# Patient Record
Sex: Male | Born: 1945 | Race: White | Hispanic: No | Smoking: Former smoker
Health system: Southern US, Community
[De-identification: ages and names within clinical notes are randomized; demographics above are authoritative.]

## PROBLEM LIST (undated history)

## (undated) DIAGNOSIS — E785 Hyperlipidemia, unspecified: Secondary | ICD-10-CM

## (undated) DIAGNOSIS — F0390 Unspecified dementia without behavioral disturbance: Secondary | ICD-10-CM

## (undated) DIAGNOSIS — I10 Essential (primary) hypertension: Secondary | ICD-10-CM

## (undated) DIAGNOSIS — R41841 Cognitive communication deficit: Secondary | ICD-10-CM

## (undated) HISTORY — DX: Cognitive communication deficit: R41.841

## (undated) HISTORY — DX: Unspecified dementia, unspecified severity, without behavioral disturbance, psychotic disturbance, mood disturbance, and anxiety: F03.90

## (undated) HISTORY — PX: JOINT REPLACEMENT: SHX530

## (undated) HISTORY — DX: Hyperlipidemia, unspecified: E78.5

---

## 2014-01-02 ENCOUNTER — Emergency Department: Payer: Self-pay | Admitting: Emergency Medicine

## 2014-01-02 LAB — COMPREHENSIVE METABOLIC PANEL
ALBUMIN: 3.7 g/dL (ref 3.4–5.0)
ANION GAP: 8 (ref 7–16)
AST: 34 U/L (ref 15–37)
Alkaline Phosphatase: 65 U/L
BUN: 13 mg/dL (ref 7–18)
Bilirubin,Total: 0.2 mg/dL (ref 0.2–1.0)
CO2: 26 mmol/L (ref 21–32)
CREATININE: 0.87 mg/dL (ref 0.60–1.30)
Calcium, Total: 8.7 mg/dL (ref 8.5–10.1)
Chloride: 99 mmol/L (ref 98–107)
EGFR (African American): >60
Glucose: 124 mg/dL — ABNORMAL HIGH (ref 65–99)
Osmolality: 268 (ref 275–301)
POTASSIUM: 3.4 mmol/L — AB (ref 3.5–5.1)
SGPT (ALT): 34 U/L (ref 12–78)
SODIUM: 133 mmol/L — AB (ref 136–145)
Total Protein: 7.8 g/dL (ref 6.4–8.2)

## 2014-01-02 LAB — CBC
HCT: 45.7 % (ref 40.0–52.0)
HGB: 15.3 g/dL (ref 13.0–18.0)
MCH: 31.9 pg (ref 26.0–34.0)
MCHC: 33.5 g/dL (ref 32.0–36.0)
MCV: 95 fL (ref 80–100)
PLATELETS: 308 10*3/uL (ref 150–440)
RBC: 4.81 10*6/uL (ref 4.40–5.90)
RDW: 13.3 % (ref 11.5–14.5)
WBC: 10.5 10*3/uL (ref 3.8–10.6)

## 2014-01-02 LAB — SALICYLATE LEVEL: Salicylates, Serum: <1.7 mg/dL

## 2014-01-02 LAB — ETHANOL
ETHANOL %: 0.306 % — AB (ref 0.000–0.080)
Ethanol: 306 mg/dL

## 2014-01-02 LAB — ACETAMINOPHEN LEVEL: Acetaminophen: <2 ug/mL

## 2014-01-02 LAB — TSH: THYROID STIMULATING HORM: 1.27 u[IU]/mL

## 2015-06-10 ENCOUNTER — Other Ambulatory Visit: Payer: Self-pay

## 2015-06-10 ENCOUNTER — Emergency Department
Admission: EM | Admit: 2015-06-10 | Discharge: 2015-06-10 | Disposition: A | Discharge status: Home / Self Care | Payer: Medicare Other | Attending: Emergency Medicine | Admitting: Emergency Medicine

## 2015-06-10 ENCOUNTER — Encounter: Payer: Self-pay | Admitting: Emergency Medicine

## 2015-06-10 DIAGNOSIS — Z79899 Other long term (current) drug therapy: Secondary | ICD-10-CM | POA: Insufficient documentation

## 2015-06-10 DIAGNOSIS — Z87891 Personal history of nicotine dependence: Secondary | ICD-10-CM | POA: Insufficient documentation

## 2015-06-10 DIAGNOSIS — J029 Acute pharyngitis, unspecified: Secondary | ICD-10-CM | POA: Diagnosis present

## 2015-06-10 DIAGNOSIS — J36 Peritonsillar abscess: Secondary | ICD-10-CM | POA: Diagnosis not present

## 2015-06-10 DIAGNOSIS — Z7982 Long term (current) use of aspirin: Secondary | ICD-10-CM | POA: Diagnosis not present

## 2015-06-10 DIAGNOSIS — I10 Essential (primary) hypertension: Secondary | ICD-10-CM | POA: Insufficient documentation

## 2015-06-10 HISTORY — DX: Essential (primary) hypertension: I10

## 2015-06-10 MED ORDER — AMOXICILLIN-POT CLAVULANATE 875-125 MG PO TABS
1.0000 | ORAL_TABLET | Freq: Once | ORAL | Status: AC
  Administered 2015-06-10: 1 via ORAL
  Filled 2015-06-10: qty 1

## 2015-06-10 MED ORDER — METHYLPREDNISOLONE 4 MG PO TABS: ORAL_TABLET | ORAL | Status: DC

## 2015-06-10 MED ORDER — BUTAMBEN-TETRACAINE-BENZOCAINE 2-2-14 % EX AERO
INHALATION_SPRAY | CUTANEOUS | Status: AC
  Administered 2015-06-10: 13:00:00
  Filled 2015-06-10: qty 20

## 2015-06-10 MED ORDER — HYDROMORPHONE HCL 1 MG/ML IJ SOLN
1.0000 mg | Freq: Once | INTRAMUSCULAR | Status: AC
  Administered 2015-06-10: 1 mg via INTRAVENOUS
  Filled 2015-06-10: qty 1

## 2015-06-10 MED ORDER — ONDANSETRON HCL 4 MG/2ML IJ SOLN
4.0000 mg | Freq: Once | INTRAMUSCULAR | Status: AC
  Administered 2015-06-10: 4 mg via INTRAVENOUS

## 2015-06-10 MED ORDER — ONDANSETRON HCL 4 MG/2ML IJ SOLN
INTRAMUSCULAR | Status: AC
  Administered 2015-06-10: 4 mg via INTRAVENOUS
  Filled 2015-06-10: qty 2

## 2015-06-10 MED ORDER — AMOXICILLIN-POT CLAVULANATE 875-125 MG PO TABS
1.0000 | ORAL_TABLET | Freq: Two times a day (BID) | ORAL | Status: AC
Start: 2015-06-10 — End: 2015-06-20

## 2015-06-10 MED ORDER — DEXAMETHASONE SODIUM PHOSPHATE 10 MG/ML IJ SOLN
10.0000 mg | Freq: Once | INTRAMUSCULAR | Status: AC
  Administered 2015-06-10: 10 mg via INTRAVENOUS
  Filled 2015-06-10: qty 1

## 2015-06-10 NOTE — ED Notes (Signed)
States was seen at urgent care for throat pain x 2 days, states was told he had abscess and sent here, no resp distress

## 2015-06-10 NOTE — ED Provider Notes (Signed)
[facility] Emergency Department Provider Note  ____________________________________________  Time seen: Approximately 12:31 PM  I have reviewed the triage vital signs and the nursing notes.   HISTORY  Chief Complaint Abscess    HPI William Schwartz is a 69 y.o. male patient sent from urgent care for possible peritonsillar abscess. Patient reports sore throat for 2 days some muffling of the voice when he spits he did spit up spit out some blood this morning after gargling. Patient complains of pain and soreness in the right side of his throat. 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 Hypertension     There are no active problems to display for this patient.   History reviewed. No pertinent past surgical history.  Current Outpatient Rx  Name  Route  Sig  Dispense  Refill  . aspirin EC 81 MG tablet   Oral   Take 81 mg by mouth every morning.         . cetirizine (ZYRTEC) 10 MG tablet   Oral   Take 10 mg by mouth daily.      3   . levothyroxine (SYNTHROID, LEVOTHROID) 125 MCG tablet   Oral   Take 125 mcg by mouth daily.      3   . lisinopril-hydrochlorothiazide (PRINZIDE,ZESTORETIC) 20-12.5 MG per tablet   Oral   Take 1 tablet by mouth every morning.      2   . metoprolol tartrate (LOPRESSOR) 25 MG tablet   Oral   Take 12.5 mg by mouth 2 (two) times daily.      10   . naproxen sodium (ALEVE) 220 MG tablet   Oral   Take 440 mg by mouth every other day.         . simvastatin (ZOCOR) 40 MG tablet   Oral   Take 40 mg by mouth at bedtime.      0   . amoxicillin-clavulanate (AUGMENTIN) 875-125 MG per tablet   Oral   Take 1 tablet by mouth every 12 (twelve) hours.   20 tablet   0   . methylPREDNISolone (MEDROL) 4 MG tablet      Medrol  dose pack, use as directed dispense 1   1 tablet   0     Allergies Review of  patient's allergies indicates no known allergies.  No family history on file.  Social History History  Substance Use Topics  . Smoking status: Former Games developer  . Smokeless tobacco: Not on file  . Alcohol Use: Yes    Review of Systems Constitutional: No fever/chills Eyes: No visual changes. ENT: See history of present illness Cardiovascular: Denies chest pain. Respiratory: Denies shortness of breath. Gastrointestinal: No abdominal pain.  No nausea, no vomiting.  No diarrhea.  No constipation. Genitourinary: Negative for dysuria. Musculoskeletal: Negative for back pain. Skin: Negative for rash. Neurological: Negative for headaches, focal weakness or numbness.  10-point ROS otherwise negative.  ____________________________________________   PHYSICAL EXAM:  VITAL SIGNS: ED Triage Vitals  Enc Vitals Group     BP 06/10/15 1049 144/83 mmHg     Pulse Rate 06/10/15 1049 88     Resp 06/10/15 1049 18     Temp 06/10/15 1049 98.5 F (36.9 C)     Temp Source 06/10/15 1049 Oral     SpO2 06/10/15 1049 99 %     Weight 06/10/15 1049 212 lb (96.163  kg)     Height 06/10/15 1049 5\' 10"  (1.778 m)     Head Cir --      Peak Flow --      Pain Score 06/10/15 1050 10     Pain Loc --      Pain Edu? --      Excl. in GC? --     Constitutional: Alert and oriented. Well appearing and in no acute distress. Eyes: Conjunctivae are normal. PERRL. EOMI. Head: Atraumatic. Nose: No congestion/rhinnorhea. Mouth/Throat: Mucous membranes are moist.  Patient with red swollen right tonsillar pillar and uvula deviating to the other side. Neck: No stridor.  Cardiovascular: Normal rate, regular rhythm. Grossly normal heart sounds.  Good peripheral circulation. Respiratory: Normal respiratory effort.  No retractions. Lungs CTAB. Gastrointestinal: Soft and nontender. No distention. No abdominal bruits. No CVA tenderness. Musculoskeletal: No lower extremity tenderness nor edema.  No joint  effusions. Neurologic:  Normal speech and language. No gross focal neurologic deficits are appreciated. No gait instability. Skin:  Skin is warm, dry and intact. No rash noted. Psychiatric: Mood and affect are normal. Speech and behavior are normal.  ____________________________________________   LABS (all labs ordered are listed, but only abnormal results are displayed)  Labs Reviewed - No data to display ____________________________________________  EKG   ____________________________________________  RADIOLOGY   ____________________________________________   PROCEDURES    ____________________________________________   INITIAL IMPRESSION / ASSESSMENT AND PLAN / ED COURSE  Pertinent labs & imaging results that were available during my care of the patient were reviewed by me and considered in my medical decision making (see chart for details).  Dr Elenore Rota comes in and drains the abscess ____________________________________________   FINAL CLINICAL IMPRESSION(S) / ED DIAGNOSES  Final diagnoses:  Tonsil, abscess      Arnaldo Natal, MD 06/10/15 1352

## 2015-06-10 NOTE — ED Notes (Signed)
Per Dr. Lannette Donath, pt okay for discharge

## 2015-06-10 NOTE — ED Notes (Signed)
Pt demonstrates no increased work of breathing and no distress

## 2015-06-10 NOTE — ED Notes (Signed)
Pt resting in bed quietly in no distress 

## 2015-06-10 NOTE — ED Notes (Signed)
Pt arrives with complaints of sore throat since Thursday, pt states he has been doing warm salt gargle and noticed blood in his spit, upon assessment pt denies any SOB or distress, large red inflammed area in the back of his right throat, pt voice full, Dr. Olean Ree notified of pt assessment, pt was seen in urgent care this AM and given rocephin

## 2015-06-10 NOTE — Consult Note (Signed)
Koehn, Salehi [id number] 1946/10/08 Arnaldo Natal, MD  Reason for Consult: Right peritonsillar abscess  HPI: Patient is a 69 year old white male who has had a history of rare tonsil problems, last infection a year and a half ago. He started with sore throat on Thursday and didn't swallow a lot for the last couple days. He coughs this morning and spit out some blood in his throat felt a little bit better. He presented the ER because sees not able to eat solid food, even though he's been drinking liquids pretty good. It feels like there is something pushing on the back of his tongue. He is having some referred pain to his right ear.  Allergies: No Known Allergies  ROS: Review of systems normal other than 12 systems except per HPI.  PMH:  Past Medical History  Diagnosis Date  . Hypertension     FH: No family history on file.  SH:  History   Social History  . Marital Status: Widowed    Spouse Name: N/A  . Number of Children: N/A  . Years of Education: N/A   Occupational History  . Not on file.   Social History Main Topics  . Smoking status: Former Games developer  . Smokeless tobacco: Not on file  . Alcohol Use: Yes  . Drug Use: Not on file  . Sexual Activity: Not on file   Other Topics Concern  . Not on file   Social History Narrative  . No narrative on file    PSH: History reviewed. No pertinent past surgical history.  Physical  Exam: Well-developed and well-nourished white male. CN 2-12 grossly intact and symmetric. EAC/TMs normal BL. Oral cavity shows his right tonsil to be quite large with an exudate area sheeting towards the midline. The uvula is slightly swollen and pushed to the left side. The soft palate is reddened and swollen. Skin warm and dry. Nasal cavity without polyps or purulence. External nose and ears without masses or lesions. EOMI, PERRLA. Neck supple with no masses or lesions. No lymphadenopathy palpated. Thyroid normal with no masses.   Aspiration of a  right peritonsillar abscess was performed and dictated in detail elsewhere. Total of 6 mL of frank pus was aspirated from the right peritonsillar area. This was sent for culture. He tolerated this well.   A/P: Right peritonsillar abscess, now aspirated for drainage. Will start on Augmentin 875 twice a day for 10 days and use a Medrol 4 mg Dosepak. If his problems lately settles down he doesn't need anything further, but if he should get swelling again in his right peritonsillar area or more pain he's to let me know and we will plan to remove his tonsils.   Jacqui Headen H 06/10/2015 1:38 PM

## 2015-06-10 NOTE — ED Notes (Addendum)
Pt had 1 episode of nausea, diaphoretic, flushed and hot, MD notified, pt given glass of water, pt just now had another episode of cold sweat, nausea, dry heaving and diaphoretic, MD at bedside, pt given zofran, EKG preformed, BP 171/90, HR 96, 95% on RA, discharge put on hold

## 2015-06-10 NOTE — Op Note (Signed)
06/10/2015  1:42 PM    William Schwartz  [id number]   Pre-Op Dx:  Right peritonsillar abscess  Post-op Dx: Right peritonsillar abscess  Proc: Aspiration of right peritonsillar abscess   Surg:  William Schwartz  Anes:  GOT  EBL:  Minimal  Comp:  None  Findings:  6 mL of frank pus removed  Procedure: After informed surgical request was signed the right soft palate overlying the tonsil was sprayed with Hurricaine spray. A 10 cc syringe with 18-gauge needle was used for aspiration of the right peritonsillar area. This was done at the superior lateral border of the tonsil. 6 mL of yellow brown pus was suctioned out and some of this was cultured. A second aspiration was done approximately 1 half-inch lower and small dribble of pus and blood. Review was removed. The patient felt almost immediately outer with less pressure there and able to swallow better  Dispo:   To be discharged home with oral medications  Plan:  Augmentin 875 twice a day for 10 days. Medrol 4 mg Dosepak. Ibuprofen for pain. Gargle every couple hours during the day to help clean out any exudate from the tonsil or polyps. Follow up in the office if the swelling and pain does not completely subside.     William Schwartz  06/10/2015 1:42 PM

## 2015-06-10 NOTE — ED Notes (Signed)
Abscess drained, MD at bedside

## 2015-06-13 LAB — WOUND CULTURE: CULTURE: NORMAL

## 2018-10-29 ENCOUNTER — Other Ambulatory Visit: Payer: Self-pay

## 2018-10-29 ENCOUNTER — Emergency Department: Payer: Self-pay

## 2018-10-29 ENCOUNTER — Encounter: Payer: Self-pay | Admitting: *Deleted

## 2018-10-29 ENCOUNTER — Emergency Department
Admission: EM | Admit: 2018-10-29 | Discharge: 2018-10-30 | Disposition: A | Discharge status: Home / Self Care | Payer: Self-pay | Attending: Emergency Medicine | Admitting: Emergency Medicine

## 2018-10-29 DIAGNOSIS — S0101XA Laceration without foreign body of scalp, initial encounter: Secondary | ICD-10-CM | POA: Insufficient documentation

## 2018-10-29 DIAGNOSIS — F10929 Alcohol use, unspecified with intoxication, unspecified: Secondary | ICD-10-CM | POA: Insufficient documentation

## 2018-10-29 DIAGNOSIS — Y999 Unspecified external cause status: Secondary | ICD-10-CM | POA: Insufficient documentation

## 2018-10-29 DIAGNOSIS — S0990XA Unspecified injury of head, initial encounter: Secondary | ICD-10-CM

## 2018-10-29 DIAGNOSIS — Y92511 Restaurant or cafe as the place of occurrence of the external cause: Secondary | ICD-10-CM | POA: Insufficient documentation

## 2018-10-29 DIAGNOSIS — Y939 Activity, unspecified: Secondary | ICD-10-CM | POA: Insufficient documentation

## 2018-10-29 DIAGNOSIS — I10 Essential (primary) hypertension: Secondary | ICD-10-CM | POA: Insufficient documentation

## 2018-10-29 DIAGNOSIS — Y908 Blood alcohol level of 240 mg/100 ml or more: Secondary | ICD-10-CM | POA: Insufficient documentation

## 2018-10-29 DIAGNOSIS — W19XXXA Unspecified fall, initial encounter: Secondary | ICD-10-CM | POA: Insufficient documentation

## 2018-10-29 DIAGNOSIS — Z87891 Personal history of nicotine dependence: Secondary | ICD-10-CM | POA: Insufficient documentation

## 2018-10-29 LAB — CBC WITH DIFFERENTIAL/PLATELET
Abs Immature Granulocytes: 0.02 10*3/uL (ref 0.00–0.07)
Basophils Absolute: 0.1 10*3/uL (ref 0.0–0.1)
Basophils Relative: 1 %
EOS ABS: 0.3 10*3/uL (ref 0.0–0.5)
Eosinophils Relative: 3 %
HCT: 43.4 % (ref 39.0–52.0)
HEMOGLOBIN: 14.7 g/dL (ref 13.0–17.0)
Immature Granulocytes: 0 %
Lymphocytes Relative: 23 %
Lymphs Abs: 2 10*3/uL (ref 0.7–4.0)
MCH: 32.6 pg (ref 26.0–34.0)
MCHC: 33.9 g/dL (ref 30.0–36.0)
MCV: 96.2 fL (ref 80.0–100.0)
Monocytes Absolute: 0.7 10*3/uL (ref 0.1–1.0)
Monocytes Relative: 8 %
NEUTROS ABS: 5.4 10*3/uL (ref 1.7–7.7)
NEUTROS PCT: 65 %
PLATELETS: 274 10*3/uL (ref 150–400)
RBC: 4.51 MIL/uL (ref 4.22–5.81)
RDW: 13.5 % (ref 11.5–15.5)
WBC: 8.4 10*3/uL (ref 4.0–10.5)
nRBC: 0 % (ref 0.0–0.2)

## 2018-10-29 LAB — COMPREHENSIVE METABOLIC PANEL
ALK PHOS: 51 U/L (ref 38–126)
ALT: 20 U/L (ref 0–44)
ANION GAP: 10 (ref 5–15)
AST: 23 U/L (ref 15–41)
Albumin: 3.9 g/dL (ref 3.5–5.0)
BILIRUBIN TOTAL: 0.6 mg/dL (ref 0.3–1.2)
BUN: 10 mg/dL (ref 8–23)
CHLORIDE: 100 mmol/L (ref 98–111)
CO2: 24 mmol/L (ref 22–32)
CREATININE: 0.68 mg/dL (ref 0.61–1.24)
Calcium: 8.3 mg/dL — ABNORMAL LOW (ref 8.9–10.3)
GFR calc non Af Amer: >60 mL/min (ref >60)
GLUCOSE: 97 mg/dL (ref 70–99)
POTASSIUM: 3.6 mmol/L (ref 3.5–5.1)
Sodium: 134 mmol/L — ABNORMAL LOW (ref 135–145)
Total Protein: 6.9 g/dL (ref 6.5–8.1)

## 2018-10-29 LAB — TROPONIN I

## 2018-10-29 LAB — ETHANOL: Alcohol, Ethyl (B): 309 mg/dL (ref <10)

## 2018-10-29 MED ORDER — LIDOCAINE-EPINEPHRINE 2 %-1:100000 IJ SOLN
INTRAMUSCULAR | Status: AC
  Filled 2018-10-29: qty 1

## 2018-10-29 NOTE — ED Notes (Signed)
Pt daughter, Elmarie Shileyiffany, can be reached at [phone]
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
## 2018-10-29 NOTE — ED Notes (Signed)
ED Provider at bedside. 
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
## 2018-10-29 NOTE — ED Notes (Signed)
The number the pt gave for Tiffany was incorrect. The actual number is [phone]

## 2018-10-29 NOTE — ED Notes (Signed)
Pt's laceration to the back of his head repaired with sutures and staples.

## 2018-10-30 NOTE — ED Notes (Signed)
No answer at 2nd emergency contact number provided for pt's daughter Elmarie Shiley(Tiffany, [phone]). VM stated the phone number was connected to a business; no VM message left d/t HIPPA concerns.

## 2018-10-30 NOTE — ED Provider Notes (Signed)
-----------------------------------------   1:44 AM on 10/30/2018 -----------------------------------------  Patient is awake, alert, speaking clearly and easily, ambulatory without any difficulty and without requiring assistance, and was able to go to the restroom without any assistance.  He is using his phone and calling for a sober adult to come pick him up.  He is in no distress and will be discharged as per plan.   Loleta RoseForbach, Karinne Schmader, MD 10/30/18 [phone]

## 2018-10-30 NOTE — ED Notes (Addendum)
No answer at emergency contact number provided for pt's daughter Elmarie Shiley(Tiffany, [phone]). HIPPA compliant VM left requesting a return phone call d/t a family member being currently treated in the ED.

## 2019-09-25 ENCOUNTER — Emergency Department
Admission: EM | Admit: 2019-09-25 | Discharge: 2019-09-25 | Disposition: A | Discharge status: Home / Self Care | Payer: No Typology Code available for payment source | Attending: Emergency Medicine | Admitting: Emergency Medicine

## 2019-09-25 ENCOUNTER — Emergency Department: Payer: No Typology Code available for payment source

## 2019-09-25 ENCOUNTER — Other Ambulatory Visit: Payer: Self-pay

## 2019-09-25 DIAGNOSIS — S0101XA Laceration without foreign body of scalp, initial encounter: Secondary | ICD-10-CM | POA: Diagnosis not present

## 2019-09-25 DIAGNOSIS — Z79899 Other long term (current) drug therapy: Secondary | ICD-10-CM | POA: Insufficient documentation

## 2019-09-25 DIAGNOSIS — I10 Essential (primary) hypertension: Secondary | ICD-10-CM | POA: Diagnosis not present

## 2019-09-25 DIAGNOSIS — W19XXXA Unspecified fall, initial encounter: Secondary | ICD-10-CM

## 2019-09-25 DIAGNOSIS — Y929 Unspecified place or not applicable: Secondary | ICD-10-CM | POA: Diagnosis not present

## 2019-09-25 DIAGNOSIS — Z87891 Personal history of nicotine dependence: Secondary | ICD-10-CM | POA: Diagnosis not present

## 2019-09-25 DIAGNOSIS — Y9389 Activity, other specified: Secondary | ICD-10-CM | POA: Insufficient documentation

## 2019-09-25 DIAGNOSIS — S0990XA Unspecified injury of head, initial encounter: Secondary | ICD-10-CM | POA: Diagnosis present

## 2019-09-25 DIAGNOSIS — Y999 Unspecified external cause status: Secondary | ICD-10-CM | POA: Insufficient documentation

## 2019-09-25 DIAGNOSIS — Z7982 Long term (current) use of aspirin: Secondary | ICD-10-CM | POA: Insufficient documentation

## 2019-09-25 MED ORDER — LIDOCAINE-EPINEPHRINE 2 %-1:100000 IJ SOLN
5.0000 mL | Freq: Once | INTRAMUSCULAR | Status: AC
  Administered 2019-09-25: 19:00:00 5 mL
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
## 2019-09-27 ENCOUNTER — Emergency Department
Admission: EM | Admit: 2019-09-27 | Discharge: 2019-09-27 | Disposition: A | Discharge status: Home / Self Care | Payer: No Typology Code available for payment source | Attending: Student | Admitting: Student

## 2019-09-27 ENCOUNTER — Other Ambulatory Visit: Payer: Self-pay

## 2019-09-27 ENCOUNTER — Emergency Department: Payer: No Typology Code available for payment source

## 2019-09-27 ENCOUNTER — Encounter: Payer: Self-pay | Admitting: Emergency Medicine

## 2019-09-27 DIAGNOSIS — Y999 Unspecified external cause status: Secondary | ICD-10-CM | POA: Insufficient documentation

## 2019-09-27 DIAGNOSIS — F10929 Alcohol use, unspecified with intoxication, unspecified: Secondary | ICD-10-CM | POA: Diagnosis not present

## 2019-09-27 DIAGNOSIS — W19XXXA Unspecified fall, initial encounter: Secondary | ICD-10-CM

## 2019-09-27 DIAGNOSIS — Y9301 Activity, walking, marching and hiking: Secondary | ICD-10-CM | POA: Diagnosis not present

## 2019-09-27 DIAGNOSIS — I10 Essential (primary) hypertension: Secondary | ICD-10-CM | POA: Diagnosis not present

## 2019-09-27 DIAGNOSIS — Z79899 Other long term (current) drug therapy: Secondary | ICD-10-CM | POA: Insufficient documentation

## 2019-09-27 DIAGNOSIS — Z7982 Long term (current) use of aspirin: Secondary | ICD-10-CM | POA: Insufficient documentation

## 2019-09-27 DIAGNOSIS — Z87891 Personal history of nicotine dependence: Secondary | ICD-10-CM | POA: Diagnosis not present

## 2019-09-27 DIAGNOSIS — Y92007 Garden or yard of unspecified non-institutional (private) residence as the place of occurrence of the external cause: Secondary | ICD-10-CM | POA: Insufficient documentation

## 2019-09-27 DIAGNOSIS — W109XXA Fall (on) (from) unspecified stairs and steps, initial encounter: Secondary | ICD-10-CM | POA: Diagnosis not present

## 2019-09-27 DIAGNOSIS — F10920 Alcohol use, unspecified with intoxication, uncomplicated: Secondary | ICD-10-CM

## 2019-09-27 DIAGNOSIS — S0990XA Unspecified injury of head, initial encounter: Secondary | ICD-10-CM | POA: Diagnosis present

## 2019-09-27 NOTE — Discharge Instructions (Signed)
Thank you for letting us take care of you in the emergency department today.   Follow up with: - Your primary care doctor to review your ER visit and follow up on your symptoms.   Please return to the ER for any new or worsening symptoms.
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
## 2019-10-06 ENCOUNTER — Other Ambulatory Visit: Payer: Self-pay

## 2019-10-06 ENCOUNTER — Emergency Department
Admission: EM | Admit: 2019-10-06 | Discharge: 2019-10-06 | Disposition: A | Discharge status: Home / Self Care | Payer: No Typology Code available for payment source | Attending: Emergency Medicine | Admitting: Emergency Medicine

## 2019-10-06 DIAGNOSIS — I10 Essential (primary) hypertension: Secondary | ICD-10-CM | POA: Insufficient documentation

## 2019-10-06 DIAGNOSIS — Z4802 Encounter for removal of sutures: Secondary | ICD-10-CM

## 2019-10-06 DIAGNOSIS — Z7982 Long term (current) use of aspirin: Secondary | ICD-10-CM | POA: Diagnosis not present

## 2019-10-06 DIAGNOSIS — X58XXXD Exposure to other specified factors, subsequent encounter: Secondary | ICD-10-CM | POA: Diagnosis not present

## 2019-10-06 DIAGNOSIS — S0101XD Laceration without foreign body of scalp, subsequent encounter: Secondary | ICD-10-CM | POA: Diagnosis not present

## 2019-10-06 DIAGNOSIS — Z87891 Personal history of nicotine dependence: Secondary | ICD-10-CM | POA: Insufficient documentation
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
## 2019-10-06 NOTE — ED Notes (Signed)
Pt verbalized understanding of dc instructions.

## 2019-10-06 NOTE — ED Triage Notes (Signed)
Pt here to get sutures removed from head.

## 2021-06-18 ENCOUNTER — Emergency Department: Payer: No Typology Code available for payment source

## 2021-06-18 ENCOUNTER — Emergency Department
Admission: EM | Admit: 2021-06-18 | Discharge: 2021-06-18 | Disposition: A | Discharge status: Home / Self Care | Payer: No Typology Code available for payment source | Attending: Emergency Medicine | Admitting: Emergency Medicine

## 2021-06-18 ENCOUNTER — Other Ambulatory Visit: Payer: Self-pay

## 2021-06-18 DIAGNOSIS — I10 Essential (primary) hypertension: Secondary | ICD-10-CM | POA: Insufficient documentation

## 2021-06-18 DIAGNOSIS — Z760 Encounter for issue of repeat prescription: Secondary | ICD-10-CM

## 2021-06-18 DIAGNOSIS — Z87891 Personal history of nicotine dependence: Secondary | ICD-10-CM | POA: Insufficient documentation

## 2021-06-18 DIAGNOSIS — Z79899 Other long term (current) drug therapy: Secondary | ICD-10-CM | POA: Insufficient documentation

## 2021-06-18 DIAGNOSIS — R0789 Other chest pain: Secondary | ICD-10-CM | POA: Diagnosis not present

## 2021-06-18 DIAGNOSIS — Z7982 Long term (current) use of aspirin: Secondary | ICD-10-CM | POA: Insufficient documentation

## 2021-06-18 LAB — CBC
HCT: 38.4 % — ABNORMAL LOW (ref 39.0–52.0)
Hemoglobin: 13.3 g/dL (ref 13.0–17.0)
MCH: 32.7 pg (ref 26.0–34.0)
MCHC: 34.6 g/dL (ref 30.0–36.0)
MCV: 94.3 fL (ref 80.0–100.0)
Platelets: 254 10*3/uL (ref 150–400)
RBC: 4.07 MIL/uL — ABNORMAL LOW (ref 4.22–5.81)
RDW: 14.2 % (ref 11.5–15.5)
WBC: 6.4 10*3/uL (ref 4.0–10.5)
nRBC: 0 % (ref 0.0–0.2)

## 2021-06-18 LAB — BASIC METABOLIC PANEL
Anion gap: 8 (ref 5–15)
BUN: 10 mg/dL (ref 8–23)
CO2: 34 mmol/L — ABNORMAL HIGH (ref 22–32)
Calcium: 8.7 mg/dL — ABNORMAL LOW (ref 8.9–10.3)
Chloride: 96 mmol/L — ABNORMAL LOW (ref 98–111)
Creatinine, Ser: 0.9 mg/dL (ref 0.61–1.24)
GFR, Estimated: >60 mL/min (ref >60)
Glucose, Bld: 91 mg/dL (ref 70–99)
Potassium: 3 mmol/L — ABNORMAL LOW (ref 3.5–5.1)
Sodium: 138 mmol/L (ref 135–145)

## 2021-06-18 LAB — TROPONIN I (HIGH SENSITIVITY): Troponin I (High Sensitivity): 9 ng/L (ref <18)

## 2021-06-18 LAB — LIPASE, BLOOD: Lipase: 28 U/L (ref 11–51)

## 2021-06-18 MED ORDER — METOPROLOL TARTRATE 25 MG PO TABS
25.0000 mg | ORAL_TABLET | Freq: Once | ORAL | Status: AC
  Administered 2021-06-18: 25 mg via ORAL
  Filled 2021-06-18: qty 1

## 2021-06-18 MED ORDER — METOPROLOL TARTRATE 25 MG PO TABS: 12.5000 mg | ORAL_TABLET | Freq: Two times a day (BID) | ORAL | 0 refills | Status: DC

## 2021-06-18 NOTE — ED Notes (Signed)
Pt to ED via EMS c/o chest pain that was resolved by room time. Pain in middle of chest, pt states he takes metoprolol everyday but ran out and has not taken it for 2 days.  Hx of heart attack, and stents placed.  Pt A&O x3, disoriented to time.  NAD at this time

## 2021-06-18 NOTE — ED Notes (Signed)
Spoke with pt's William Schwartz at this time, pt's daughter and POA stating and went over d/c instructions at this time. Daughter states she would get her husband to come pick him up because she is at work. Pt notified of this update.

## 2021-06-18 NOTE — Discharge Instructions (Addendum)
Follow up with your primary care provider.  Return to the ER for symptoms of concern if unable to go to the [place].
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
## 2021-06-18 NOTE — ED Provider Notes (Signed)
[facility] Emergency Department Provider Note ____________________________________________   Event Date/Time   First MD Initiated Contact with Patient 06/18/21 1402     (approximate)  I have reviewed the triage vital signs and the nursing notes.   HISTORY  Chief Complaint Abdominal Pain and Chest Pain  HPI William Schwartz is a 75 y.o. male with history of hypertension, CAD, MI with stent placement presents to the emergency department for treatment and evaluation of a brief period of midsternal chest pain that lasted for approximately 5 minutes.  He also states that his friends told him that he was "[employer]" today and encouraged him to come to the ER. Patient states that he is out of Metoprolol and didn't have a dose to take today. He denies pain at this time.      Past Medical History:  Diagnosis Date   Hypertension     There are no problems to display for this patient.   No past surgical history on file.  Prior to Admission medications   Medication Sig Start Date End Date Taking? Authorizing Provider  aspirin EC 81 MG tablet Take 81 mg by mouth every morning.    [provider]  cetirizine (ZYRTEC) 10 MG tablet Take 10 mg by mouth daily. 05/21/15   [provider]  levothyroxine (SYNTHROID, LEVOTHROID) 125 MCG tablet Take 125 mcg by mouth daily. 05/26/15   [provider]  lisinopril-hydrochlorothiazide (PRINZIDE,ZESTORETIC) 20-12.5 MG per tablet Take 1 tablet by mouth every morning. 04/03/15   [provider]  methylPREDNISolone (MEDROL) 4 MG tablet Medrol 4mg  dose pack, use as directed dispense 1 06/10/15   06/12/15, MD  metoprolol tartrate (LOPRESSOR) 25 MG tablet Take 0.5 tablets (12.5 mg total) by mouth 2 (two) times daily. 06/18/21   Alisha Bacus B, FNP  naproxen sodium (ALEVE) 220 MG tablet Take 440 mg by mouth every other day.    [provider]  simvastatin (ZOCOR) 40 MG tablet Take 40 mg  by mouth at bedtime. 04/16/15   [provider]    Allergies Patient has no known allergies.  No family history on file.  Social History Social History   Tobacco Use   Smoking status: Former  Substance Use Topics   Alcohol use: Yes    Review of Systems  Constitutional: No fever/chills Eyes: No visual changes. ENT: No sore throat. Cardiovascular: Denies chest pain. Respiratory: Denies shortness of breath. Gastrointestinal: No abdominal pain.  No nausea, no vomiting.  No diarrhea.  No constipation. Genitourinary: Negative for dysuria. Musculoskeletal: Negative for back pain. Skin: Negative for rash. Neurological: Negative for headaches, focal weakness or numbness. ____________________________________________   PHYSICAL EXAM:  VITAL SIGNS: ED Triage Vitals [06/18/21 1240]  Enc Vitals Group     BP (!) 156/101     Pulse Rate 66     Resp 16     Temp 98.2 F (36.8 C)     Temp Source Oral     SpO2 96 %     Weight 176 lb 5.9 oz (80 kg)     Height 5\' 11"  (1.803 m)     Head Circumference      Peak Flow      Pain Score 5     Pain Loc      Pain Edu?      Excl. in GC?     Constitutional: Alert and oriented. Overall well appearing and in no acute distress. Eyes: Conjunctivae are normal. Head: Atraumatic.  Nose: No congestion/rhinnorhea. Mouth/Throat: Mucous membranes are moist.  Oropharynx non-erythematous. Neck: No stridor.   Hematological/Lymphatic/Immunilogical: No cervical lymphadenopathy. Cardiovascular: Normal rate, regular rhythm. Grossly normal heart sounds.  Good peripheral circulation. Respiratory: Normal respiratory effort.  No retractions. Lungs CTAB. Gastrointestinal: Soft and nontender. No distention. No abdominal bruits. Genitourinary:  Musculoskeletal: No lower extremity tenderness nor edema.  No joint effusions. Neurologic:  Normal speech and language. No gross focal neurologic deficits are appreciated. No gait instability. Skin:  Skin is  warm, dry and intact. No rash noted. Psychiatric: Mood and affect are normal. Speech and behavior are normal.  ____________________________________________   LABS (all labs ordered are listed, but only abnormal results are displayed)  Labs Reviewed  BASIC METABOLIC PANEL - Abnormal; Notable for the following components:      Result Value   Potassium 3.0 (*)    Chloride 96 (*)    CO2 34 (*)    Calcium 8.7 (*)    All other components within normal limits  CBC - Abnormal; Notable for the following components:   RBC 4.07 (*)    HCT 38.4 (*)    All other components within normal limits  LIPASE, BLOOD  TROPONIN I (HIGH SENSITIVITY)   ____________________________________________  EKG  ED ECG REPORT I, Wetzel Meester, FNP-BC personally viewed and interpreted this ECG.   Date: 06/18/2021  EKG Time: 1242  Rate: 66  Rhythm: normal EKG, normal sinus rhythm  Axis: normal  Intervals:none  ST&T Change: no ST elevation  ____________________________________________  RADIOLOGY  ED MD interpretation:    No concerns on chest x-ray.  I, Kem Boroughs, personally viewed and evaluated these images (plain radiographs) as part of my medical decision making, as well as reviewing the written report by the radiologist.  Official radiology report(s): DG Chest 2 View  Result Date: 06/18/2021 CLINICAL DATA:  Intermittent chest pain and abdominal pain EXAM: CHEST - 2 VIEW COMPARISON:  01/02/2014 FINDINGS: Suspected emphysema. Atherosclerotic calcification of the aortic arch. Heart size within normal limits. The lungs appear otherwise clear. Mild dextroconvex thoracic scoliosis. IMPRESSION: 1. Aortic Atherosclerosis (ICD10-I70.0) and Emphysema (ICD10-J43.9). 2. No acute radiographic findings. Electronically Signed   By: Gaylyn Rong M.D.   On: 06/18/2021 13:48    ____________________________________________   PROCEDURES  Procedure(s) performed (including Critical  Care):  Procedures  ____________________________________________   INITIAL IMPRESSION / ASSESSMENT AND PLAN     75 year old male presenting to the ER for chest pain that has resolved and description of being off balance earlier today. He relates the pain to not having his metoprolol and the off balance issue to his cholesterol medication.  DIFFERENTIAL DIAGNOSIS  CAD; angina; electrolyte imbalance; symptomatic hypertension; MI  ED COURSE  EKG, Troponin and remaining studies are reassuring. No recurrence of chest pain.  He was given his dose of metoprolol and will be given a prescription until his medicines are refilled by the [place].  Patient was encouraged to return to the emergency department for symptoms that return, change, or worsen if he is unable to be evaluated at the [facility].    ___________________________________________   FINAL CLINICAL IMPRESSION(S) / ED DIAGNOSES  Final diagnoses:  Atypical chest pain  Medication refill     ED Discharge Orders          Ordered    metoprolol tartrate (LOPRESSOR) 25 MG tablet  2 times daily        06/18/21 1503  Julaine Hua was evaluated in Emergency Department on 06/18/2021 for the symptoms described in the history of present illness. He was evaluated in the context of the global COVID-19 pandemic, which necessitated consideration that the patient might be at risk for infection with the SARS-CoV-2 virus that causes COVID-19. Institutional protocols and algorithms that pertain to the evaluation of patients at risk for COVID-19 are in a state of rapid change based on information released by regulatory bodies including the CDC and federal and state organizations. These policies and algorithms were followed during the patient's care in the ED.   Note:  This document was prepared using Dragon voice recognition software and may include unintentional dictation errors.    Chinita Pester, FNP 06/18/21 1508    Georga Hacking, MD 06/18/21 [phone]

## 2021-06-18 NOTE — ED Notes (Signed)
Offered pt sandwich tray at this time. Pt refused.

## 2021-06-18 NOTE — ED Triage Notes (Signed)
Pt presents to the ED via EMS from home with c/o intermittent, diffuse chest pain and abd pain that began this morning upon waking. Pt denies any other symptoms at this time.

## 2021-07-04 ENCOUNTER — Other Ambulatory Visit: Payer: Self-pay

## 2021-07-04 ENCOUNTER — Emergency Department
Admission: EM | Admit: 2021-07-04 | Discharge: 2021-07-04 | Disposition: A | Discharge status: Home / Self Care | Payer: No Typology Code available for payment source | Attending: Emergency Medicine | Admitting: Emergency Medicine

## 2021-07-04 ENCOUNTER — Emergency Department: Payer: No Typology Code available for payment source

## 2021-07-04 ENCOUNTER — Encounter: Payer: Self-pay | Admitting: Emergency Medicine

## 2021-07-04 DIAGNOSIS — S0990XA Unspecified injury of head, initial encounter: Secondary | ICD-10-CM

## 2021-07-04 DIAGNOSIS — Z79899 Other long term (current) drug therapy: Secondary | ICD-10-CM | POA: Diagnosis not present

## 2021-07-04 DIAGNOSIS — I10 Essential (primary) hypertension: Secondary | ICD-10-CM | POA: Diagnosis not present

## 2021-07-04 DIAGNOSIS — Z7982 Long term (current) use of aspirin: Secondary | ICD-10-CM | POA: Diagnosis not present

## 2021-07-04 DIAGNOSIS — W01198A Fall on same level from slipping, tripping and stumbling with subsequent striking against other object, initial encounter: Secondary | ICD-10-CM | POA: Diagnosis not present

## 2021-07-04 DIAGNOSIS — Z87891 Personal history of nicotine dependence: Secondary | ICD-10-CM | POA: Insufficient documentation

## 2021-07-04 DIAGNOSIS — S0003XA Contusion of scalp, initial encounter: Secondary | ICD-10-CM | POA: Diagnosis not present

## 2021-07-04 DIAGNOSIS — Y9301 Activity, walking, marching and hiking: Secondary | ICD-10-CM | POA: Insufficient documentation

## 2021-07-04 NOTE — ED Notes (Signed)
Pt given a lemon lime to drink, pt reminded that his daughter is on the way to get him, pt's door left open upon request. Pt asking to wait in the lobby however the provider wants pt to stay in the room to wait for safety precautions

## 2021-07-04 NOTE — ED Triage Notes (Signed)
Pt comes into the ED via EMS from home, states he tripped and fell backwards hitting the back of his head, denies LOC, no blood thinners. A/ox4

## 2021-07-04 NOTE — ED Notes (Signed)
Pt daughter William Schwartz updated to the best of this RN ability at request of pt

## 2021-07-04 NOTE — Discharge Instructions (Addendum)
Return to the emergency department for symptoms of concern if unable to see primary care.

## 2021-07-04 NOTE — ED Provider Notes (Signed)
[facility] Emergency Department Provider Note ____________________________________________   Event Date/Time   First MD Initiated Contact with Patient 07/04/21 1910     (approximate)  I have reviewed the triage vital signs and the nursing notes.   HISTORY  Chief Complaint Fall  HPI William Schwartz is a 75 y.o. male with history as listed below presents to the emergency department for treatment and evaluation after mechanical, non-syncopal fall.  Patient states that he was walking down the hallway and later that was on a walker stopped right in front of him and he bumped into her and fell backward.  Has a laceration to the back of his head.  No headache, neck pain, back pain, or extremity pain.  He has been ambulatory since the incident.         Past Medical History:  Diagnosis Date   Hypertension     There are no problems to display for this patient.   History reviewed. No pertinent surgical history.  Prior to Admission medications   Medication Sig Start Date End Date Taking? Authorizing Provider  aspirin EC 81 MG tablet Take 81 mg by mouth every morning.    [provider]  cetirizine (ZYRTEC) 10 MG tablet Take 10 mg by mouth daily. 05/21/15   [provider]  levothyroxine (SYNTHROID, LEVOTHROID) 125 MCG tablet Take 125 mcg by mouth daily. 05/26/15   [provider]  lisinopril-hydrochlorothiazide (PRINZIDE,ZESTORETIC) 20-12.5 MG per tablet Take 1 tablet by mouth every morning. 04/03/15   [provider]  methylPREDNISolone (MEDROL) 4 MG tablet Medrol 4mg  dose pack, use as directed dispense 1 06/10/15   06/12/15, MD  metoprolol tartrate (LOPRESSOR) 25 MG tablet Take 0.5 tablets (12.5 mg total) by mouth 2 (two) times daily. 06/18/21   Corrin Sieling B, FNP  naproxen sodium (ALEVE) 220 MG tablet Take 440 mg by mouth every other day.    [provider]  simvastatin (ZOCOR) 40 MG tablet Take 40 mg by mouth  at bedtime. 04/16/15   [provider]    Allergies Patient has no known allergies.  History reviewed. No pertinent family history.  Social History Social History   Tobacco Use   Smoking status: Former  Substance Use Topics   Alcohol use: Yes    Review of Systems  Constitutional: No fever/chills Eyes: No visual changes. ENT: No sore throat. Cardiovascular: Denies chest pain. Respiratory: Denies shortness of breath. Gastrointestinal: No abdominal pain.  No nausea, no vomiting.  No diarrhea.  No constipation. Genitourinary: Negative for dysuria. Musculoskeletal: Negative for back pain. Skin: Negative for rash. Neurological: Negative for headaches, focal weakness or numbness.  ____________________________________________   PHYSICAL EXAM:  VITAL SIGNS: ED Triage Vitals  Enc Vitals Group     BP 07/04/21 1712 140/90     Pulse Rate 07/04/21 1712 (!) 51     Resp 07/04/21 1712 18     Temp 07/04/21 1712 97.9 F (36.6 C)     Temp Source 07/04/21 1712 Oral     SpO2 07/04/21 1712 99 %     Weight 07/04/21 1716 176 lb 5.9 oz (80 kg)     Height 07/04/21 1716 5\' 11"  (1.803 m)     Head Circumference --      Peak Flow --      Pain Score 07/04/21 1716 7     Pain Loc --      Pain Edu? --      Excl. in GC? --  Constitutional: Alert and oriented. Well appearing and in no acute distress. Eyes: Conjunctivae are normal. PERRL. EOMI. Head: Hematoma to the posterior scalp with overlying superficial abrasion. Nose: No congestion/rhinnorhea. Mouth/Throat: Mucous membranes are moist.  Oropharynx non-erythematous. Neck: No stridor.   Hematological/Lymphatic/Immunilogical: No cervical lymphadenopathy. Cardiovascular: Normal rate, regular rhythm. Grossly normal heart sounds.  Good peripheral circulation. Respiratory: Normal respiratory effort.  No retractions. Lungs CTAB. Gastrointestinal: Soft and nontender. No distention. No abdominal bruits. Genitourinary:   Musculoskeletal: No lower extremity tenderness nor edema.  No joint effusions. Neurologic:  Normal speech and language. No gross focal neurologic deficits are appreciated.  Skin:  Skin is warm, dry and intact. No rash noted. Psychiatric: Mood and affect are normal. Speech and behavior are normal.  ____________________________________________   LABS (all labs ordered are listed, but only abnormal results are displayed)  Labs Reviewed - No data to display ____________________________________________  EKG  Not indicated ____________________________________________  RADIOLOGY  ED MD interpretation:    CT head and cervical spine are negative for acute concerns per radiology.  I, Kem Boroughs, personally viewed and evaluated these images (plain radiographs) as part of my medical decision making, as well as reviewing the written report by the radiologist.  Official radiology report(s): CT HEAD WO CONTRAST ( )  Result Date: 07/04/2021 CLINICAL DATA:  Head injury after fall. EXAM: CT HEAD WITHOUT CONTRAST CT CERVICAL SPINE WITHOUT CONTRAST TECHNIQUE: Multidetector CT imaging of the head and cervical spine was performed following the standard protocol without intravenous contrast. Multiplanar CT image reconstructions of the cervical spine were also generated. COMPARISON:  September 27, 2019. FINDINGS: CT HEAD FINDINGS Brain: Mild diffuse cortical atrophy is noted. No mass effect or midline shift is noted. Ventricular size is within normal limits. There is no evidence of mass lesion, hemorrhage or acute infarction. Vascular: No hyperdense vessel or unexpected calcification. Skull: Normal. Negative for fracture or focal lesion. Sinuses/Orbits: No acute finding. Other: Small right posterior scalp hematoma is noted. CT CERVICAL SPINE FINDINGS Alignment: Normal. Skull base and vertebrae: No acute fracture. No primary bone lesion or focal pathologic process. Soft tissues and spinal canal: No  prevertebral fluid or swelling. No visible canal hematoma. Disc levels: Moderate degenerative disc disease is noted at C5-6, C6-7 and C7-T1. Upper chest: Negative. Other: Mild degenerative changes are seen involving posterior facet joints bilaterally. IMPRESSION: Small right posterior scalp hematoma is noted. No acute intracranial abnormality seen. Moderate multilevel degenerative disc disease. No acute abnormality seen in the cervical spine. Electronically Signed   By: Lupita Raider M.D.   On: 07/04/2021 19:54   CT Cervical Spine Wo Contrast  Result Date: 07/04/2021 CLINICAL DATA:  Head injury after fall. EXAM: CT HEAD WITHOUT CONTRAST CT CERVICAL SPINE WITHOUT CONTRAST TECHNIQUE: Multidetector CT imaging of the head and cervical spine was performed following the standard protocol without intravenous contrast. Multiplanar CT image reconstructions of the cervical spine were also generated. COMPARISON:  September 27, 2019. FINDINGS: CT HEAD FINDINGS Brain: Mild diffuse cortical atrophy is noted. No mass effect or midline shift is noted. Ventricular size is within normal limits. There is no evidence of mass lesion, hemorrhage or acute infarction. Vascular: No hyperdense vessel or unexpected calcification. Skull: Normal. Negative for fracture or focal lesion. Sinuses/Orbits: No acute finding. Other: Small right posterior scalp hematoma is noted. CT CERVICAL SPINE FINDINGS Alignment: Normal. Skull base and vertebrae: No acute fracture. No primary bone lesion or focal pathologic process. Soft tissues and spinal canal: No prevertebral fluid or swelling. No visible  canal hematoma. Disc levels: Moderate degenerative disc disease is noted at C5-6, C6-7 and C7-T1. Upper chest: Negative. Other: Mild degenerative changes are seen involving posterior facet joints bilaterally. IMPRESSION: Small right posterior scalp hematoma is noted. No acute intracranial abnormality seen. Moderate multilevel degenerative disc disease. No  acute abnormality seen in the cervical spine. Electronically Signed   By: Lupita Raider M.D.   On: 07/04/2021 19:54    ____________________________________________   PROCEDURES  Procedure(s) performed (including Critical Care):  Procedures  ____________________________________________   INITIAL IMPRESSION / ASSESSMENT AND PLAN     74 year old male presenting to the emergency department for treatment and evaluation after a mechanical, nonsyncopal fall prior to arrival.  See HPI for further details. CT head and cervical spine ordered.  DIFFERENTIAL DIAGNOSIS  ICH, skull fracture, scalp hematoma/laceration, concussion  ED COURSE  CTs are clear. Patient stable for discharge. He is anxiously awaiting his daughter's arrival to take him home.    ___________________________________________   FINAL CLINICAL IMPRESSION(S) / ED DIAGNOSES  Final diagnoses:  Scalp hematoma, initial encounter  Minor head injury, initial encounter     ED Discharge Orders     None        William Schwartz was evaluated in Emergency Department on 07/04/2021 for the symptoms described in the history of present illness. He was evaluated in the context of the global COVID-19 pandemic, which necessitated consideration that the patient might be at risk for infection with the SARS-CoV-2 virus that causes COVID-19. Institutional protocols and algorithms that pertain to the evaluation of patients at risk for COVID-19 are in a state of rapid change based on information released by regulatory bodies including the CDC and federal and state organizations. These policies and algorithms were followed during the patient's care in the ED.   Note:  This document was prepared using Dragon voice recognition software and may include unintentional dictation errors.    Chinita Pester, FNP 07/04/21 2225    Sharyn Creamer, MD 07/10/21 1106

## 2021-07-04 NOTE — ED Triage Notes (Signed)
Pt comes into the ED via [facility] from his residence c/o fall.  Pt states a lady in front of him using a walker stopped suddenly and he ran into the back of her and then fell backwards.  Pt hit the back of his head, denies any LOC, and no blood thinners.  PT currently A&Ox4.  Pt in NAD at this time.

## 2021-07-05 ENCOUNTER — Other Ambulatory Visit: Payer: Self-pay

## 2021-07-05 ENCOUNTER — Encounter: Payer: Self-pay | Admitting: Emergency Medicine

## 2021-07-05 ENCOUNTER — Emergency Department: Payer: No Typology Code available for payment source

## 2021-07-05 ENCOUNTER — Emergency Department
Admission: EM | Admit: 2021-07-05 | Discharge: 2021-07-05 | Disposition: A | Discharge status: Home / Self Care | Payer: No Typology Code available for payment source | Attending: Emergency Medicine | Admitting: Emergency Medicine

## 2021-07-05 DIAGNOSIS — Z7982 Long term (current) use of aspirin: Secondary | ICD-10-CM | POA: Diagnosis not present

## 2021-07-05 DIAGNOSIS — Z79899 Other long term (current) drug therapy: Secondary | ICD-10-CM | POA: Diagnosis not present

## 2021-07-05 DIAGNOSIS — S59901A Unspecified injury of right elbow, initial encounter: Secondary | ICD-10-CM | POA: Diagnosis present

## 2021-07-05 DIAGNOSIS — S51012A Laceration without foreign body of left elbow, initial encounter: Secondary | ICD-10-CM | POA: Insufficient documentation

## 2021-07-05 DIAGNOSIS — S51011A Laceration without foreign body of right elbow, initial encounter: Secondary | ICD-10-CM | POA: Diagnosis not present

## 2021-07-05 DIAGNOSIS — Z87891 Personal history of nicotine dependence: Secondary | ICD-10-CM | POA: Insufficient documentation

## 2021-07-05 DIAGNOSIS — S0003XA Contusion of scalp, initial encounter: Secondary | ICD-10-CM | POA: Diagnosis not present

## 2021-07-05 DIAGNOSIS — W06XXXA Fall from bed, initial encounter: Secondary | ICD-10-CM | POA: Diagnosis not present

## 2021-07-05 DIAGNOSIS — M6282 Rhabdomyolysis: Secondary | ICD-10-CM | POA: Diagnosis not present

## 2021-07-05 DIAGNOSIS — E876 Hypokalemia: Secondary | ICD-10-CM | POA: Diagnosis not present

## 2021-07-05 DIAGNOSIS — I10 Essential (primary) hypertension: Secondary | ICD-10-CM | POA: Insufficient documentation

## 2021-07-05 DIAGNOSIS — R748 Abnormal levels of other serum enzymes: Secondary | ICD-10-CM | POA: Diagnosis not present

## 2021-07-05 DIAGNOSIS — W19XXXA Unspecified fall, initial encounter: Secondary | ICD-10-CM

## 2021-07-05 LAB — URINALYSIS, COMPLETE (UACMP) WITH MICROSCOPIC
Bilirubin Urine: NEGATIVE
Glucose, UA: NEGATIVE mg/dL
Ketones, ur: NEGATIVE mg/dL
Leukocytes,Ua: NEGATIVE
Nitrite: NEGATIVE
Protein, ur: 100 mg/dL — AB
Specific Gravity, Urine: 1.01 (ref 1.005–1.030)
pH: 9 — ABNORMAL HIGH (ref 5.0–8.0)

## 2021-07-05 LAB — BASIC METABOLIC PANEL
Anion gap: 15 (ref 5–15)
BUN: 14 mg/dL (ref 8–23)
CO2: 30 mmol/L (ref 22–32)
Calcium: 8.9 mg/dL (ref 8.9–10.3)
Chloride: 90 mmol/L — ABNORMAL LOW (ref 98–111)
Creatinine, Ser: 1.01 mg/dL (ref 0.61–1.24)
GFR, Estimated: >60 mL/min (ref >60)
Glucose, Bld: 96 mg/dL (ref 70–99)
Potassium: 3 mmol/L — ABNORMAL LOW (ref 3.5–5.1)
Sodium: 135 mmol/L (ref 135–145)

## 2021-07-05 LAB — CBC
HCT: 42.4 % (ref 39.0–52.0)
Hemoglobin: 15.3 g/dL (ref 13.0–17.0)
MCH: 33.2 pg (ref 26.0–34.0)
MCHC: 36.1 g/dL — ABNORMAL HIGH (ref 30.0–36.0)
MCV: 92 fL (ref 80.0–100.0)
Platelets: 313 10*3/uL (ref 150–400)
RBC: 4.61 MIL/uL (ref 4.22–5.81)
RDW: 14.1 % (ref 11.5–15.5)
WBC: 13.7 10*3/uL — ABNORMAL HIGH (ref 4.0–10.5)
nRBC: 0 % (ref 0.0–0.2)

## 2021-07-05 LAB — CK: Total CK: 3693 U/L — ABNORMAL HIGH (ref 49–397)

## 2021-07-05 MED ORDER — POTASSIUM CHLORIDE CRYS ER 20 MEQ PO TBCR
40.0000 meq | EXTENDED_RELEASE_TABLET | Freq: Once | ORAL | Status: AC
  Administered 2021-07-05: 40 meq via ORAL
  Filled 2021-07-05: qty 2

## 2021-07-05 MED ORDER — SODIUM CHLORIDE 0.9 % IV BOLUS
1000.0000 mL | Freq: Once | INTRAVENOUS | Status: AC
  Administered 2021-07-05: 1000 mL via INTRAVENOUS

## 2021-07-05 NOTE — ED Triage Notes (Signed)
Pt in via EMS from [facility] with c/o unwitnessed fall last pm. Pt rolled out of bed and landed on his chest. Pt has been laying on the floor all night. Skin tear on left elbow.

## 2021-07-05 NOTE — ED Triage Notes (Signed)
Pt comes into the ED via [facility] from [facility] c/o unwitnessed fall last night.  Pt rolled out of his bed and landed on his chest and stomach.  PT has been laying on the floor all  night and there is a skin tear present to the left elbow.

## 2021-07-05 NOTE — Discharge Instructions (Addendum)
Please seek medical attention for any high fevers, chest pain, shortness of breath, change in behavior, persistent vomiting, bloody stool or any other new or concerning symptoms.  

## 2021-07-05 NOTE — ED Notes (Signed)
IV removed per patient. Patient urinated all over the floor. Patient cleaned and bedding changed at this time. Patient resting comfortably. Fall precautions in place. Bed alarm activated and audible. Urinal at bedside.

## 2021-07-05 NOTE — ED Provider Notes (Addendum)
[facility] Emergency Department Provider Note   ____________________________________________   I have reviewed the triage vital signs and the nursing notes.   HISTORY  Chief Complaint Fall   History limited by: Not Limited   HPI William Schwartz is a 75 y.o. male who presents to the emergency department today because of a fall. The patient states he remembers going to bed last night and then falling out of bed. He is not sure why he fell out of bed. Per the daughter who called on the telephone his apartment has pull strings which he pulled and then apartment staff found him on the ground. The patient states his only injury was bumping his elbows a little. He denies any recent illness.    Records reviewed. Per medical record review patient has a history of HTN. Seen in the emergency department yesterday after an apparent mechanical fall.  Past Medical History:  Diagnosis Date   Hypertension     There are no problems to display for this patient.   History reviewed. No pertinent surgical history.  Prior to Admission medications   Medication Sig Start Date End Date Taking? Authorizing Provider  aspirin EC 81 MG tablet Take 81 mg by mouth every morning.    [provider]  cetirizine (ZYRTEC) 10 MG tablet Take 10 mg by mouth daily. 05/21/15   [provider]  levothyroxine (SYNTHROID, LEVOTHROID) 125 MCG tablet Take 125 mcg by mouth daily. 05/26/15   [provider]  lisinopril-hydrochlorothiazide (PRINZIDE,ZESTORETIC) 20-12.5 MG per tablet Take 1 tablet by mouth every morning. 04/03/15   [provider]  methylPREDNISolone (MEDROL) 4 MG tablet Medrol 4mg  dose pack, use as directed dispense 1 06/10/15   06/12/15, MD  metoprolol tartrate (LOPRESSOR) 25 MG tablet Take 0.5 tablets (12.5 mg total) by mouth 2 (two) times daily. 06/18/21   Triplett, Cari B, FNP  naproxen sodium (ALEVE) 220 MG tablet Take 440 mg by mouth every  other day.    [provider]  simvastatin (ZOCOR) 40 MG tablet Take 40 mg by mouth at bedtime. 04/16/15   [provider]    Allergies Patient has no known allergies.  History reviewed. No pertinent family history.  Social History Social History   Tobacco Use   Smoking status: Former  Substance Use Topics   Alcohol use: Yes    Review of Systems Constitutional: No fever/chills Eyes: No visual changes. ENT: No sore throat. Cardiovascular: Denies chest pain. Respiratory: Denies shortness of breath. Gastrointestinal: No abdominal pain.  No nausea, no vomiting.  No diarrhea.   Genitourinary: Negative for dysuria. Musculoskeletal: Negative for back pain. Skin: Positive for skin tears to arms. Neurological: Negative for headaches, focal weakness or numbness.  ____________________________________________   PHYSICAL EXAM:  VITAL SIGNS: ED Triage Vitals  Enc Vitals Group     BP 07/05/21 1019 110/71     Pulse Rate 07/05/21 1019 (!) 52     Resp 07/05/21 1019 17     Temp 07/05/21 1019 98.2 F (36.8 C)     Temp Source 07/05/21 1019 Oral     SpO2 07/05/21 1019 100 %     Weight 07/05/21 1017 176 lb 5.9 oz (80 kg)     Height 07/05/21 1017 5\' 11"  (1.803 m)     Head Circumference --      Peak Flow --      Pain Score 07/05/21 1016 7   Constitutional: Alert and oriented.  Eyes: Conjunctivae are normal.  ENT      Head: Normocephalic and atraumatic.      Nose: No congestion/rhinnorhea.      Mouth/Throat: Mucous membranes are moist.      Neck: No stridor. Hematological/Lymphatic/Immunilogical: No cervical lymphadenopathy. Cardiovascular: Normal rate, regular rhythm.  No murmurs, rubs, or gallops.  Respiratory: Normal respiratory effort without tachypnea nor retractions. Breath sounds are clear and equal bilaterally. No wheezes/rales/rhonchi. Gastrointestinal: Soft and non tender. No rebound. No guarding.  Genitourinary: Deferred Musculoskeletal: Normal range  of motion in all extremities. No lower extremity edema. Neurologic:  Normal speech and language. No gross focal neurologic deficits are appreciated.  Skin:  Skin tears to upper extremities.  Psychiatric: Mood and affect are normal. Speech and behavior are normal. Patient exhibits appropriate insight and judgment.  ____________________________________________    LABS (pertinent positives/negatives)  BMP wnl except k 3.0, cl 90 CK 3693 CBC wbc 13.7, hgb 15.3, plt 313 UA clear, small hgb dipstick, protein 100, 6-10 rbc, 0-5 wbc ____________________________________________   EKG  I, Phineas Semen, attending physician, personally viewed and interpreted this EKG  EKG Time: 1026 Rate: 52 Rhythm: sinus bradycardia Axis: normal Intervals: qtc 442 QRS: narrow ST changes: no st elevation Impression: abnormal ekg   ____________________________________________    RADIOLOGY  CT head No acute abnormality  CXR No active disease ____________________________________________   PROCEDURES  Procedures  ____________________________________________   INITIAL IMPRESSION / ASSESSMENT AND PLAN / ED COURSE  Pertinent labs & imaging results that were available during my care of the patient were reviewed by me and considered in my medical decision making (see chart for details).   Patient presented to the emergency department today after being found on the ground.  Patient states he fell out of the bed last night.  He is unclear why he fell.  On exam here patient did have some superficial skin tears.  No significant traumatic injury.  Blood work did show a slightly elevated CK and hypokalemia.  Did have some concerns about patient's safety at home and did have a long discussion with the patient.  At this time patient states he has no interest in being placed at his living facility.  I also discussed this with his daughter over the telephone.  He was able to ambulate here in the emergency  department.  Also encouraged good oral hydration.  Encourage patient have creatinine rechecked.   ____________________________________________   FINAL CLINICAL IMPRESSION(S) / ED DIAGNOSES  Final diagnoses:  Fall, initial encounter  Elevated CK  Hypokalemia     Note: This dictation was prepared with Dragon dictation. Any transcriptional errors that result from this process are unintentional     Phineas Semen, MD 07/05/21 1843    Phineas Semen, MD 07/05/21 [phone]

## 2021-07-05 NOTE — ED Notes (Signed)
See triage note  Presents s/p fall  Pt states he fell from bed and laid on the floor last pm  Pt has several bruised area  Skin tear to left arm

## 2021-07-06 ENCOUNTER — Other Ambulatory Visit: Payer: Self-pay

## 2021-07-06 ENCOUNTER — Emergency Department: Payer: No Typology Code available for payment source

## 2021-07-06 ENCOUNTER — Inpatient Hospital Stay
Admission: EM | Admit: 2021-07-06 | Discharge: 2021-07-10 | DRG: 557 | Disposition: A | Discharge status: Skilled Nursing Facility | Payer: No Typology Code available for payment source | Attending: Internal Medicine | Admitting: Internal Medicine

## 2021-07-06 DIAGNOSIS — I10 Essential (primary) hypertension: Secondary | ICD-10-CM | POA: Diagnosis present

## 2021-07-06 DIAGNOSIS — Z7982 Long term (current) use of aspirin: Secondary | ICD-10-CM

## 2021-07-06 DIAGNOSIS — G9341 Metabolic encephalopathy: Secondary | ICD-10-CM | POA: Diagnosis present

## 2021-07-06 DIAGNOSIS — Z79899 Other long term (current) drug therapy: Secondary | ICD-10-CM

## 2021-07-06 DIAGNOSIS — S0101XA Laceration without foreign body of scalp, initial encounter: Secondary | ICD-10-CM | POA: Diagnosis present

## 2021-07-06 DIAGNOSIS — M6282 Rhabdomyolysis: Principal | ICD-10-CM | POA: Diagnosis present

## 2021-07-06 DIAGNOSIS — R296 Repeated falls: Secondary | ICD-10-CM | POA: Diagnosis present

## 2021-07-06 DIAGNOSIS — T502X5A Adverse effect of carbonic-anhydrase inhibitors, benzothiadiazides and other diuretics, initial encounter: Secondary | ICD-10-CM | POA: Diagnosis present

## 2021-07-06 DIAGNOSIS — E039 Hypothyroidism, unspecified: Secondary | ICD-10-CM | POA: Diagnosis present

## 2021-07-06 DIAGNOSIS — E876 Hypokalemia: Secondary | ICD-10-CM | POA: Diagnosis present

## 2021-07-06 DIAGNOSIS — W06XXXA Fall from bed, initial encounter: Secondary | ICD-10-CM | POA: Diagnosis present

## 2021-07-06 DIAGNOSIS — R4182 Altered mental status, unspecified: Secondary | ICD-10-CM | POA: Diagnosis present

## 2021-07-06 DIAGNOSIS — Z79890 Hormone replacement therapy (postmenopausal): Secondary | ICD-10-CM

## 2021-07-06 DIAGNOSIS — F17210 Nicotine dependence, cigarettes, uncomplicated: Secondary | ICD-10-CM | POA: Diagnosis present

## 2021-07-06 DIAGNOSIS — Z20822 Contact with and (suspected) exposure to covid-19: Secondary | ICD-10-CM | POA: Diagnosis present

## 2021-07-06 LAB — BASIC METABOLIC PANEL
Anion gap: 9 (ref 5–15)
BUN: 25 mg/dL — ABNORMAL HIGH (ref 8–23)
CO2: 32 mmol/L (ref 22–32)
Calcium: 9 mg/dL (ref 8.9–10.3)
Chloride: 96 mmol/L — ABNORMAL LOW (ref 98–111)
Creatinine, Ser: 1.17 mg/dL (ref 0.61–1.24)
GFR, Estimated: >60 mL/min (ref >60)
Glucose, Bld: 113 mg/dL — ABNORMAL HIGH (ref 70–99)
Potassium: 2.8 mmol/L — ABNORMAL LOW (ref 3.5–5.1)
Sodium: 137 mmol/L (ref 135–145)

## 2021-07-06 LAB — CBC WITH DIFFERENTIAL/PLATELET
Abs Immature Granulocytes: 0.08 10*3/uL — ABNORMAL HIGH (ref 0.00–0.07)
Basophils Absolute: 0.1 10*3/uL (ref 0.0–0.1)
Basophils Relative: 0 %
Eosinophils Absolute: 0 10*3/uL (ref 0.0–0.5)
Eosinophils Relative: 0 %
HCT: 38.3 % — ABNORMAL LOW (ref 39.0–52.0)
Hemoglobin: 13.7 g/dL (ref 13.0–17.0)
Immature Granulocytes: 1 %
Lymphocytes Relative: 5 %
Lymphs Abs: 0.7 10*3/uL (ref 0.7–4.0)
MCH: 32.9 pg (ref 26.0–34.0)
MCHC: 35.8 g/dL (ref 30.0–36.0)
MCV: 91.8 fL (ref 80.0–100.0)
Monocytes Absolute: 0.6 10*3/uL (ref 0.1–1.0)
Monocytes Relative: 4 %
Neutro Abs: 12.3 10*3/uL — ABNORMAL HIGH (ref 1.7–7.7)
Neutrophils Relative %: 90 %
Platelets: 266 10*3/uL (ref 150–400)
RBC: 4.17 MIL/uL — ABNORMAL LOW (ref 4.22–5.81)
RDW: 14.2 % (ref 11.5–15.5)
WBC: 13.8 10*3/uL — ABNORMAL HIGH (ref 4.0–10.5)
nRBC: 0 % (ref 0.0–0.2)

## 2021-07-06 LAB — TROPONIN I (HIGH SENSITIVITY)
Troponin I (High Sensitivity): 16 ng/L (ref <18)
Troponin I (High Sensitivity): 14 ng/L (ref <18)

## 2021-07-06 MED ORDER — POTASSIUM CHLORIDE CRYS ER 20 MEQ PO TBCR
40.0000 meq | EXTENDED_RELEASE_TABLET | Freq: Once | ORAL | Status: AC
  Administered 2021-07-07: 40 meq via ORAL

## 2021-07-06 NOTE — ED Notes (Signed)
Patient transported to CT 

## 2021-07-06 NOTE — ED Notes (Signed)
Ice cream, graham crackers and ginger ale provided

## 2021-07-06 NOTE — ED Provider Notes (Signed)
[facility] Emergency Department Provider Note   ____________________________________________   I have reviewed the triage vital signs and the nursing notes.   HISTORY  Chief Complaint Altered Mental Status and Fall   History limited by: Not Limited   HPI William Schwartz is a 75 y.o. male who presents to the emergency department today to the emergency department today for a fall.  Patient was seen in the emergency department yesterday for a fall.  Patient appears to be roughly in the same state of health that he was yesterday when I saw him.  Patient not complaining of any pain.  The patient denies any headache.  Unclear why the patient fell today.  Per EMS there was report that the neighbors thought he was more confused today.  Records reviewed. Per medical record review patient has a history of HTN. Recent ER visits for falls.   Past Medical History:  Diagnosis Date   Hypertension     There are no problems to display for this patient.   History reviewed. No pertinent surgical history.  Prior to Admission medications   Medication Sig Start Date End Date Taking? Authorizing Provider  aspirin EC 81 MG tablet Take 81 mg by mouth every morning.    [provider]  cetirizine (ZYRTEC) 10 MG tablet Take 10 mg by mouth daily. 05/21/15   [provider]  levothyroxine (SYNTHROID, LEVOTHROID) 125 MCG tablet Take 125 mcg by mouth daily. 05/26/15   [provider]  lisinopril-hydrochlorothiazide (PRINZIDE,ZESTORETIC) 20-12.5 MG per tablet Take 1 tablet by mouth every morning. 04/03/15   [provider]  methylPREDNISolone (MEDROL) 4 MG tablet Medrol 4mg  dose pack, use as directed dispense 1 06/10/15   06/12/15, MD  metoprolol tartrate (LOPRESSOR) 25 MG tablet Take 0.5 tablets (12.5 mg total) by mouth 2 (two) times daily. 06/18/21   Triplett, Cari B, FNP  naproxen sodium (ALEVE) 220 MG tablet Take 440 mg by mouth every other day.     [provider]  simvastatin (ZOCOR) 40 MG tablet Take 40 mg by mouth at bedtime. 04/16/15   [provider]    Allergies Patient has no known allergies.  History reviewed. No pertinent family history.  Social History Social History   Tobacco Use   Smoking status: Former  Substance Use Topics   Alcohol use: Yes    Review of Systems Constitutional: No fever/chills Eyes: No visual changes. ENT: No sore throat. Cardiovascular: Denies chest pain. Respiratory: Denies shortness of breath. Gastrointestinal: No abdominal pain.  No nausea, no vomiting.  No diarrhea.   Genitourinary: Negative for dysuria. Musculoskeletal: Negative for back pain. Skin: Negative for rash. Neurological: Negative for headaches, focal weakness or numbness.  ____________________________________________   PHYSICAL EXAM:  VITAL SIGNS: ED Triage Vitals  Enc Vitals Group     BP 07/06/21 1934 (!) 176/102     Pulse Rate 07/06/21 1934 (!) 58     Resp 07/06/21 1934 18     Temp 07/06/21 1934 97.8 F (36.6 C)     Temp Source 07/06/21 1934 Oral     SpO2 07/06/21 1934 100 %     Weight 07/06/21 1932 176 lb 5.9 oz (80 kg)     Height 07/06/21 1932 5\' 11"  (1.803 m)   Constitutional: Awake and alert.  Eyes: Conjunctivae are normal.  ENT      Head: Normocephalic and atraumatic.      Nose: No congestion/rhinnorhea.      Mouth/Throat: Mucous membranes  are moist.      Neck: No stridor. Hematological/Lymphatic/Immunilogical: No cervical lymphadenopathy. Cardiovascular: Normal rate, regular rhythm.  No murmurs, rubs, or gallops.  Respiratory: Normal respiratory effort without tachypnea nor retractions. Breath sounds are clear and equal bilaterally. No wheezes/rales/rhonchi. Gastrointestinal: Soft and non tender. No rebound. No guarding.  Genitourinary: Deferred Musculoskeletal: Normal range of motion in all extremities. No lower extremity edema. Neurologic:  Normal speech and language. Not  completely oriented to events.  Skin:  Skin is warm and dry. Psychiatric: Mood and affect are normal. Speech and behavior are normal. Patient exhibits appropriate insight and judgment.  ____________________________________________    LABS (pertinent positives/negatives)  Trop hs 14 BMP na 137, k 2.8, glu 113, cr 1.17 CBC wbc 13.8, hgb 13.7, plt 266  ____________________________________________   EKG  None  ____________________________________________    RADIOLOGY  CT head/cervical spine No acute abnormality  ____________________________________________   PROCEDURES  Procedures  ____________________________________________   INITIAL IMPRESSION / ASSESSMENT AND PLAN / ED COURSE  Pertinent labs & imaging results that were available during my care of the patient were reviewed by me and considered in my medical decision making (see chart for details).   [name] presents to the emergency department today for a fall.  Patient has had frequent falls.  In discussion with patient yesterday did not have interest in facility.  However at this time given frequent number of falls I do think he would benefit from living facility.  CT head and cervical spine today without any concerning abnormalities.  Patient continues to have mild leukocytosis without any clear etiology.  Will have Child psychotherapist and PT evaluate.   ___________________________________________   FINAL CLINICAL IMPRESSION(S) / ED DIAGNOSES  Fall    Note: This dictation was prepared with Dragon dictation. Any transcriptional errors that result from this process are unintentional     Phineas Semen, MD 07/06/21 2357

## 2021-07-06 NOTE — ED Triage Notes (Signed)
Pt BIBA from home for increased confusion and multiple Falls. Pt was seen yesterday for same sx. vitals WNL. Pt A+Ox2 on arrival.

## 2021-07-07 ENCOUNTER — Emergency Department: Payer: No Typology Code available for payment source

## 2021-07-07 ENCOUNTER — Observation Stay: Payer: No Typology Code available for payment source

## 2021-07-07 ENCOUNTER — Inpatient Hospital Stay: Payer: No Typology Code available for payment source

## 2021-07-07 ENCOUNTER — Encounter: Payer: Self-pay | Admitting: Internal Medicine

## 2021-07-07 DIAGNOSIS — T796XXD Traumatic ischemia of muscle, subsequent encounter: Secondary | ICD-10-CM

## 2021-07-07 DIAGNOSIS — M6282 Rhabdomyolysis: Secondary | ICD-10-CM | POA: Diagnosis present

## 2021-07-07 DIAGNOSIS — E876 Hypokalemia: Secondary | ICD-10-CM | POA: Diagnosis not present

## 2021-07-07 DIAGNOSIS — R296 Repeated falls: Secondary | ICD-10-CM

## 2021-07-07 DIAGNOSIS — S0101XA Laceration without foreign body of scalp, initial encounter: Secondary | ICD-10-CM | POA: Diagnosis present

## 2021-07-07 DIAGNOSIS — F17210 Nicotine dependence, cigarettes, uncomplicated: Secondary | ICD-10-CM | POA: Diagnosis present

## 2021-07-07 DIAGNOSIS — Z79890 Hormone replacement therapy (postmenopausal): Secondary | ICD-10-CM | POA: Diagnosis not present

## 2021-07-07 DIAGNOSIS — Z20822 Contact with and (suspected) exposure to covid-19: Secondary | ICD-10-CM | POA: Diagnosis present

## 2021-07-07 DIAGNOSIS — G9341 Metabolic encephalopathy: Secondary | ICD-10-CM | POA: Diagnosis present

## 2021-07-07 DIAGNOSIS — E039 Hypothyroidism, unspecified: Secondary | ICD-10-CM | POA: Diagnosis present

## 2021-07-07 DIAGNOSIS — T502X5A Adverse effect of carbonic-anhydrase inhibitors, benzothiadiazides and other diuretics, initial encounter: Secondary | ICD-10-CM | POA: Diagnosis present

## 2021-07-07 DIAGNOSIS — F17200 Nicotine dependence, unspecified, uncomplicated: Secondary | ICD-10-CM | POA: Insufficient documentation

## 2021-07-07 DIAGNOSIS — R410 Disorientation, unspecified: Secondary | ICD-10-CM | POA: Diagnosis not present

## 2021-07-07 DIAGNOSIS — Z7982 Long term (current) use of aspirin: Secondary | ICD-10-CM | POA: Diagnosis not present

## 2021-07-07 DIAGNOSIS — I10 Essential (primary) hypertension: Secondary | ICD-10-CM | POA: Diagnosis present

## 2021-07-07 DIAGNOSIS — R4182 Altered mental status, unspecified: Secondary | ICD-10-CM | POA: Diagnosis present

## 2021-07-07 DIAGNOSIS — W06XXXA Fall from bed, initial encounter: Secondary | ICD-10-CM | POA: Diagnosis present

## 2021-07-07 DIAGNOSIS — Z79899 Other long term (current) drug therapy: Secondary | ICD-10-CM | POA: Diagnosis not present

## 2021-07-07 LAB — HEPATIC FUNCTION PANEL
ALT: 34 U/L (ref 0–44)
AST: 89 U/L — ABNORMAL HIGH (ref 15–41)
Albumin: 3.6 g/dL (ref 3.5–5.0)
Alkaline Phosphatase: 36 U/L — ABNORMAL LOW (ref 38–126)
Bilirubin, Direct: 0.2 mg/dL (ref 0.0–0.2)
Indirect Bilirubin: 0.9 mg/dL (ref 0.3–0.9)
Total Bilirubin: 1.1 mg/dL (ref 0.3–1.2)
Total Protein: 6.5 g/dL (ref 6.5–8.1)

## 2021-07-07 LAB — CBC WITH DIFFERENTIAL/PLATELET
Abs Immature Granulocytes: 0.06 10*3/uL (ref 0.00–0.07)
Basophils Absolute: 0.1 10*3/uL (ref 0.0–0.1)
Basophils Relative: 1 %
Eosinophils Absolute: 0.1 10*3/uL (ref 0.0–0.5)
Eosinophils Relative: 1 %
HCT: 40.8 % (ref 39.0–52.0)
Hemoglobin: 14.1 g/dL (ref 13.0–17.0)
Immature Granulocytes: 1 %
Lymphocytes Relative: 13 %
Lymphs Abs: 1.3 10*3/uL (ref 0.7–4.0)
MCH: 32.1 pg (ref 26.0–34.0)
MCHC: 34.6 g/dL (ref 30.0–36.0)
MCV: 92.9 fL (ref 80.0–100.0)
Monocytes Absolute: 0.5 10*3/uL (ref 0.1–1.0)
Monocytes Relative: 5 %
Neutro Abs: 7.9 10*3/uL — ABNORMAL HIGH (ref 1.7–7.7)
Neutrophils Relative %: 79 %
Platelets: 276 10*3/uL (ref 150–400)
RBC: 4.39 MIL/uL (ref 4.22–5.81)
RDW: 14.6 % (ref 11.5–15.5)
WBC: 9.9 10*3/uL (ref 4.0–10.5)
nRBC: 0 % (ref 0.0–0.2)

## 2021-07-07 LAB — URINALYSIS, COMPLETE (UACMP) WITH MICROSCOPIC
Bacteria, UA: NONE SEEN
Bilirubin Urine: NEGATIVE
Glucose, UA: NEGATIVE mg/dL
Ketones, ur: NEGATIVE mg/dL
Leukocytes,Ua: NEGATIVE
Nitrite: NEGATIVE
Protein, ur: 100 mg/dL — AB
Specific Gravity, Urine: 1.023 (ref 1.005–1.030)
pH: 5 (ref 5.0–8.0)

## 2021-07-07 LAB — URINE DRUG SCREEN, QUALITATIVE (ARMC ONLY)
Amphetamines, Ur Screen: NOT DETECTED
Barbiturates, Ur Screen: NOT DETECTED
Benzodiazepine, Ur Scrn: NOT DETECTED
Cannabinoid 50 Ng, Ur ~~LOC~~: NOT DETECTED
Cocaine Metabolite,Ur ~~LOC~~: NOT DETECTED
MDMA (Ecstasy)Ur Screen: NOT DETECTED
Methadone Scn, Ur: NOT DETECTED
Opiate, Ur Screen: NOT DETECTED
Phencyclidine (PCP) Ur S: NOT DETECTED
Tricyclic, Ur Screen: NOT DETECTED

## 2021-07-07 LAB — BASIC METABOLIC PANEL
Anion gap: 8 (ref 5–15)
Anion gap: 6 (ref 5–15)
BUN: 19 mg/dL (ref 8–23)
BUN: 18 mg/dL (ref 8–23)
CO2: 27 mmol/L (ref 22–32)
CO2: 30 mmol/L (ref 22–32)
Calcium: 8.2 mg/dL — ABNORMAL LOW (ref 8.9–10.3)
Calcium: 8.7 mg/dL — ABNORMAL LOW (ref 8.9–10.3)
Chloride: 103 mmol/L (ref 98–111)
Chloride: 102 mmol/L (ref 98–111)
Creatinine, Ser: 0.88 mg/dL (ref 0.61–1.24)
Creatinine, Ser: 0.89 mg/dL (ref 0.61–1.24)
GFR, Estimated: >60 mL/min (ref >60)
GFR, Estimated: >60 mL/min (ref >60)
Glucose, Bld: 89 mg/dL (ref 70–99)
Glucose, Bld: 98 mg/dL (ref 70–99)
Potassium: 3.2 mmol/L — ABNORMAL LOW (ref 3.5–5.1)
Potassium: 3.4 mmol/L — ABNORMAL LOW (ref 3.5–5.1)
Sodium: 138 mmol/L (ref 135–145)
Sodium: 138 mmol/L (ref 135–145)

## 2021-07-07 LAB — CK: Total CK: 5175 U/L — ABNORMAL HIGH (ref 49–397)

## 2021-07-07 LAB — RESP PANEL BY RT-PCR (FLU A&B, COVID) ARPGX2
Influenza A by PCR: NEGATIVE
Influenza B by PCR: NEGATIVE
SARS Coronavirus 2 by RT PCR: NEGATIVE

## 2021-07-07 LAB — ETHANOL: Alcohol, Ethyl (B): <10 mg/dL (ref <10)

## 2021-07-07 LAB — MAGNESIUM: Magnesium: 2 mg/dL (ref 1.7–2.4)

## 2021-07-07 LAB — FOLATE: Folate: 7.2 ng/mL (ref >5.9)

## 2021-07-07 LAB — TSH: TSH: 102.231 u[IU]/mL — ABNORMAL HIGH (ref 0.350–4.500)

## 2021-07-07 LAB — AMMONIA: Ammonia: <9 umol/L — ABNORMAL LOW (ref 9–35)

## 2021-07-07 LAB — CBG MONITORING, ED: Glucose-Capillary: 105 mg/dL — ABNORMAL HIGH (ref 70–99)

## 2021-07-07 LAB — TROPONIN I (HIGH SENSITIVITY): Troponin I (High Sensitivity): 17 ng/L (ref <18)

## 2021-07-07 MED ORDER — ONDANSETRON HCL 4 MG/2ML IJ SOLN: 4.0000 mg | Freq: Four times a day (QID) | INTRAMUSCULAR | Status: DC | PRN

## 2021-07-07 MED ORDER — ENOXAPARIN SODIUM 40 MG/0.4ML IJ SOSY
40.0000 mg | PREFILLED_SYRINGE | INTRAMUSCULAR | Status: DC
  Administered 2021-07-07 – 2021-07-10 (×4): 40 mg via SUBCUTANEOUS
  Filled 2021-07-07 (×4): qty 0.4

## 2021-07-07 MED ORDER — POTASSIUM CHLORIDE 10 MEQ/100ML IV SOLN
10.0000 meq | INTRAVENOUS | Status: AC
  Administered 2021-07-07 (×2): 10 meq via INTRAVENOUS
  Filled 2021-07-07 (×2): qty 100

## 2021-07-07 MED ORDER — LISINOPRIL-HYDROCHLOROTHIAZIDE 20-12.5 MG PO TABS: 1.0000 | ORAL_TABLET | ORAL | Status: DC

## 2021-07-07 MED ORDER — HYDROCHLOROTHIAZIDE 12.5 MG PO CAPS
12.5000 mg | ORAL_CAPSULE | Freq: Every day | ORAL | Status: DC
  Administered 2021-07-07 – 2021-07-08 (×2): 12.5 mg via ORAL
  Filled 2021-07-07 (×2): qty 1

## 2021-07-07 MED ORDER — LISINOPRIL 20 MG PO TABS
20.0000 mg | ORAL_TABLET | Freq: Every day | ORAL | Status: DC
  Administered 2021-07-07 – 2021-07-08 (×2): 20 mg via ORAL
  Filled 2021-07-07: qty 1
  Filled 2021-07-07: qty 2
  Filled 2021-07-07: qty 1

## 2021-07-07 MED ORDER — ONDANSETRON HCL 4 MG PO TABS: 4.0000 mg | ORAL_TABLET | Freq: Four times a day (QID) | ORAL | Status: DC | PRN

## 2021-07-07 MED ORDER — SODIUM CHLORIDE 0.9 % IV BOLUS (SEPSIS)
1000.0000 mL | Freq: Once | INTRAVENOUS | Status: DC
Start: 2021-07-07 — End: 2021-07-10

## 2021-07-07 MED ORDER — ACETAMINOPHEN 325 MG PO TABS
650.0000 mg | ORAL_TABLET | Freq: Four times a day (QID) | ORAL | Status: DC | PRN
  Administered 2021-07-08: 650 mg via ORAL
  Filled 2021-07-07: qty 2

## 2021-07-07 MED ORDER — POTASSIUM CHLORIDE IN NACL 40-0.9 MEQ/L-% IV SOLN
INTRAVENOUS | Status: DC
  Filled 2021-07-07 (×5): qty 1000

## 2021-07-07 MED ORDER — LEVOTHYROXINE SODIUM 50 MCG PO TABS
125.0000 ug | ORAL_TABLET | Freq: Every day | ORAL | Status: DC
  Administered 2021-07-08 – 2021-07-10 (×3): 125 ug via ORAL
  Filled 2021-07-07 (×3): qty 1

## 2021-07-07 MED ORDER — ACETAMINOPHEN 650 MG RE SUPP: 650.0000 mg | Freq: Four times a day (QID) | RECTAL | Status: DC | PRN

## 2021-07-07 MED ORDER — METOPROLOL TARTRATE 25 MG PO TABS
12.5000 mg | ORAL_TABLET | Freq: Two times a day (BID) | ORAL | Status: DC
  Administered 2021-07-07 – 2021-07-10 (×6): 12.5 mg via ORAL
  Filled 2021-07-07 (×7): qty 1

## 2021-07-07 MED ORDER — POTASSIUM CHLORIDE CRYS ER 20 MEQ PO TBCR
40.0000 meq | EXTENDED_RELEASE_TABLET | Freq: Two times a day (BID) | ORAL | Status: AC
  Administered 2021-07-07 (×2): 40 meq via ORAL
  Filled 2021-07-07 (×2): qty 2

## 2021-07-07 MED ORDER — SODIUM CHLORIDE 0.9 % IV SOLN: INTRAVENOUS | Status: DC

## 2021-07-07 MED ORDER — LORATADINE 10 MG PO TABS
10.0000 mg | ORAL_TABLET | Freq: Every day | ORAL | Status: DC
  Administered 2021-07-07 – 2021-07-10 (×4): 10 mg via ORAL
  Filled 2021-07-07 (×4): qty 1

## 2021-07-07 MED ORDER — NICOTINE 14 MG/24HR TD PT24
14.0000 mg | MEDICATED_PATCH | Freq: Every day | TRANSDERMAL | Status: DC
  Administered 2021-07-07 – 2021-07-10 (×4): 14 mg via TRANSDERMAL
  Filled 2021-07-07 (×4): qty 1

## 2021-07-07 MED ORDER — ASPIRIN EC 81 MG PO TBEC
81.0000 mg | DELAYED_RELEASE_TABLET | ORAL | Status: DC
  Administered 2021-07-07 – 2021-07-10 (×4): 81 mg via ORAL
  Filled 2021-07-07 (×4): qty 1

## 2021-07-07 NOTE — ED Notes (Signed)
Pt declined to use bathroom at this time, ex lav/green/blue drawn and sent to lab

## 2021-07-07 NOTE — ED Notes (Signed)
Rn to bedside. Pt is sleeping. IV running and hooked to monitor.

## 2021-07-07 NOTE — ED Provider Notes (Signed)
  Physical Exam  BP 99/64   Pulse (!) 53   Temp 98 F (36.7 C) (Oral)   Resp 14   Ht 5\' 11"  (1.803 m)   Wt 80 kg   SpO2 97%   BMI 24.60 kg/m   Physical Exam  ED Course/Procedures     Procedures  MDM  4:00 AM  Patient here with frequent falls.  Apparently lives at home.  Patient was initially signed out as awaiting social work evaluation for placement and physical therapy.  On my reevaluation however, patient appears confused with very difficult to understand his speech.  It is unclear what his baseline is.  On his recent ED visit he was found to have an elevated CK level and was hydrated.  He also has a leukocytosis but no fever here and no obvious source of infection.  We will repeat labs, chest x-ray, urine.  I feel he may require medical admission given this is now his third visit to the emergency department for falls.    [name] work-up reveals CK level greater than 5000.  It looks like his creatinine is slowly trending up.  Patient does not have a urinary tract infection.  Blood glucose is normal.  COVID and flu negative.  Troponins negative.  Chest x-ray clear.  Will discuss with hospitalist for admission for hydration for rhabdomyolysis, physical therapy and placement given multiple recent falls.    7:53 AM Discussed patient's case with hospitalist, Dr. .  I have recommended admission and patient (and family if present) agree with this plan. Admitting physician will place admission orders.   I reviewed all nursing notes, vitals, pertinent previous records and reviewed/interpreted all EKGs, lab and urine results, imaging (as available).    Spoke with daughter who is his POA William Schwartz at [phone]).  She reports he lives in a senior living apartment alone.  She reports he is not eating and drinking recently.  Worsening speech and confusion for 6 months.  She thinks he may have dementia.  She states it is not normal for him to have multiple falls.  She agrees  with admission to the hospital.   William Schwartz, [phone], DO 07/07/21 [phone]

## 2021-07-07 NOTE — H&P (Signed)
History and Physical    BRANNAN CASSEDY LOV:[id number] DOB: [birth year] DOA: 07/06/2021  PCP: [facility]   Patient coming from: Home  I have personally briefly reviewed patient's old medical records in [facility]  Chief Complaint: Falls Most of the history is obtained from the EMR as patient is confused  HPI: William Schwartz is a 75 y.o. male with medical history significant for hypertension who has had multiple ER visits for evaluation of falls.  Patient resides in a senior living apartment alone and per daughter has had poor oral intake.  She is also concerned that he has become increasingly confused over the last couple of months and his speech is slurred. Patient has been holding in the ER awaiting evaluation for placement and physical therapy but labs revealed hypokalemia as well as elevated total CK levels and so admission has been requested. I am unable to do review of systems on this patient due to his confusion. Labs show sodium 137, potassium 2.8, chloride 96, bicarb 32, glucose 113, BUN 25, creatinine 1.17, calcium 9.0, magnesium 2.0, alkaline phosphatase 36, albumin 3.6, AST 89, ALT 34, total protein 6.5, direct bilirubin 0.2, indirect bilirubin 0.9, total CK5 175, troponin 14, white count 13.8, hemoglobin 13.7, hematocrit 38.3, MCV 91.8, RDW 14.2, Respiratory viral panel is negative CT scan of the head without contrast/cervical spine CT shows no acute intracranial abnormality.  No acute displaced fracture or traumatic listhesis of the cervical spine. Chest x-ray reviewed by me shows no evidence of acute cardiopulmonary disease.  ED Course: Patient is a 75 year old male who has had multiple ER visits for evaluation of falls and was holding in the ER for placement. Admission is requested for evaluation of worsening confusion, slurred speech, elevated total CK levels and hypokalemia.   Review of Systems: As per HPI otherwise all other systems reviewed and  negative.    Past Medical History:  Diagnosis Date   Hypertension     History reviewed. No pertinent surgical history.   reports that he has quit smoking. His smoking use included cigarettes. He smoked an average of .5 packs per day. He does not have any smokeless tobacco history on file. He reports current alcohol use. No history on file for drug use.  No Known Allergies  History reviewed. No pertinent family history.  Patient is unable to provide any history  Prior to Admission medications   Medication Sig Start Date End Date Taking? Authorizing Provider  aspirin EC 81 MG tablet Take 81 mg by mouth every morning.    [provider]  cetirizine (ZYRTEC) 10 MG tablet Take 10 mg by mouth daily. 05/21/15   [provider]  levothyroxine (SYNTHROID, LEVOTHROID) 125 MCG tablet Take 125 mcg by mouth daily. 05/26/15   [provider]  lisinopril-hydrochlorothiazide (PRINZIDE,ZESTORETIC) 20-12.5 MG per tablet Take 1 tablet by mouth every morning. 04/03/15   [provider]  methylPREDNISolone (MEDROL) 4 MG tablet Medrol 4mg  dose pack, use as directed dispense 1 06/10/15   06/12/15, MD  metoprolol tartrate (LOPRESSOR) 25 MG tablet Take 0.5 tablets (12.5 mg total) by mouth 2 (two) times daily. 06/18/21   Triplett, Cari B, FNP  naproxen sodium (ALEVE) 220 MG tablet Take 440 mg by mouth every other day.    [provider]  simvastatin (ZOCOR) 40 MG tablet Take 40 mg by mouth at bedtime. 04/16/15   [provider]    Physical Exam: Vitals:   07/06/21 2230 07/06/21 2343  07/07/21 0614 07/07/21 0657  BP: (!) 146/90 (!) 170/94 108/67 99/64  Pulse: (!) 59 65 (!) 52 (!) 53  Resp: 15 18 14 14   Temp:   98 F (36.7 C)   TempSrc:   Oral   SpO2: 100% 99% 100% 97%  Weight:      Height:         Vitals:   07/06/21 2230 07/06/21 2343 07/07/21 0614 07/07/21 0657  BP: (!) 146/90 (!) 170/94 108/67 99/64  Pulse: (!) 59 65 (!) 52 (!) 53  Resp: 15 18  14 14   Temp:   98 F (36.7 C)   TempSrc:   Oral   SpO2: 100% 99% 100% 97%  Weight:      Height:          Constitutional: Alert and oriented to person and place, not to time. Not in any apparent distress HEENT:      Head: Normocephalic and atraumatic.         Eyes: PERLA, EOMI, Conjunctivae are normal. Sclera is non-icteric.       Mouth/Throat: Mucous membranes are moist.       Neck: Supple with no signs of meningismus. Cardiovascular: Regular rate and rhythm. No murmurs, gallops, or rubs. 2+ symmetrical distal pulses are present . No JVD. No LE edema Respiratory: Respiratory effort normal .Lungs sounds clear bilaterally. No wheezes, crackles, or rhonchi.  Gastrointestinal: Soft, non tender, and non distended with positive bowel sounds.  Genitourinary: No CVA tenderness. Musculoskeletal: Nontender with normal range of motion in all extremities. No cyanosis, or erythema of extremities. Neurologic:  Face is symmetric. Moving all extremities. No gross focal neurologic deficits. Moves allall extremities Skin: Skin is warm, dry.  No rash or ulcers Psychiatric: Mood and affect are normal    Labs on Admission: I have personally reviewed following labs and imaging studies  CBC: Recent Labs  Lab 07/05/21 1023 07/06/21 1951  WBC 13.7* 13.8*  NEUTROABS  --  12.3*  HGB 15.3 13.7  HCT 42.4 38.3*  MCV 92.0 91.8  PLT 313 266   Basic Metabolic Panel: Recent Labs  Lab 07/05/21 1023 07/06/21 1951 07/06/21 2226  NA 135 137  --   K 3.0* 2.8*  --   CL 90* 96*  --   CO2 30 32  --   GLUCOSE 96 113*  --   BUN 14 25*  --   CREATININE 1.01 1.17  --   CALCIUM 8.9 9.0  --   MG  --   --  2.0   GFR: Estimated Creatinine Clearance: 59.9 mL/min (by C-G formula based on SCr of 1.17 mg/dL). Liver Function Tests: Recent Labs  Lab 07/07/21 0612  AST 89*  ALT 34  ALKPHOS 36*  BILITOT 1.1  PROT 6.5  ALBUMIN 3.6   No results for input(s): LIPASE, AMYLASE in the last 168 hours. No  results for input(s): AMMONIA in the last 168 hours. Coagulation Profile: No results for input(s): INR, PROTIME in the last 168 hours. Cardiac Enzymes: Recent Labs  Lab 07/05/21 1023 07/06/21 2226  CKTOTAL 3,693* 5,175*   BNP (last 3 results) No results for input(s): PROBNP in the last 8760 hours. HbA1C: No results for input(s): HGBA1C in the last 72 hours. CBG: Recent Labs  Lab 07/07/21 0640  GLUCAP 105*   Lipid Profile: No results for input(s): CHOL, HDL, LDLCALC, TRIG, CHOLHDL, LDLDIRECT in the last 72 hours. Thyroid Function Tests: No results for input(s): TSH, T4TOTAL, FREET4, T3FREE, THYROIDAB in the  last 72 hours. Anemia Panel: No results for input(s): VITAMINB12, FOLATE, FERRITIN, TIBC, IRON, RETICCTPCT in the last 72 hours. Urine analysis:    Component Value Date/Time   COLORURINE YELLOW (A) 07/07/2021 0612   APPEARANCEUR HAZY (A) 07/07/2021 0612   LABSPEC 1.023 07/07/2021 0612   PHURINE 5.0 07/07/2021 0612   GLUCOSEU NEGATIVE 07/07/2021 0612   HGBUR MODERATE (A) 07/07/2021 0612   BILIRUBINUR NEGATIVE 07/07/2021 0612   KETONESUR NEGATIVE 07/07/2021 0612   PROTEINUR 100 (A) 07/07/2021 0612   NITRITE NEGATIVE 07/07/2021 0612   LEUKOCYTESUR NEGATIVE 07/07/2021 0612    Radiological Exams on Admission: CT HEAD WO CONTRAST ( )  Result Date: 07/06/2021 CLINICAL DATA:  Status post fall.  Increased confusion. EXAM: CT HEAD WITHOUT CONTRAST CT CERVICAL SPINE WITHOUT CONTRAST TECHNIQUE: Multidetector CT imaging of the head and cervical spine was performed following the standard protocol without intravenous contrast. Multiplanar CT image reconstructions of the cervical spine were also generated. COMPARISON:  CT head 07/05/2021. FINDINGS: CT HEAD FINDINGS Brain: Cerebral ventricle sizes are concordant with the degree of cerebral volume loss. No evidence of large-territorial acute infarction. No parenchymal hemorrhage. No mass lesion. No extra-axial collection. No mass  effect or midline shift. No hydrocephalus. Basilar cisterns are patent. Vascular: No hyperdense vessel. Atherosclerotic calcifications are present within the cavernous internal carotid arteries. Skull: No acute fracture or focal lesion. Sinuses/Orbits: Paranasal sinuses and mastoid air cells are clear. The orbits are unremarkable. Other: In stray shin of a mild right parieto-occipital scalp edema and 6 mm hematoma. No new hematoma formation. CT CERVICAL SPINE FINDINGS Alignment: Normal. Skull base and vertebrae: Multilevel moderate degenerative changes of the spine. No acute fracture. No aggressive appearing focal osseous lesion or focal pathologic process. Soft tissues and spinal canal: No prevertebral fluid or swelling. No visible canal hematoma. Upper chest: Unremarkable. Other: None. IMPRESSION: 1. No acute intracranial abnormality. 2. No acute displaced fracture or traumatic listhesis of the cervical spine. Electronically Signed   By: Tish Frederickson M.D.   On: 07/06/2021 23:29   CT HEAD WO CONTRAST ( )  Result Date: 07/05/2021 CLINICAL DATA:  Larey Seat out of bed last night. EXAM: CT HEAD WITHOUT CONTRAST TECHNIQUE: Contiguous axial images were obtained from the base of the skull through the vertex without intravenous contrast. COMPARISON:  CT head from yesterday. FINDINGS: Brain: No evidence of acute infarction, hemorrhage, hydrocephalus, extra-axial collection or mass lesion/mass effect. Stable atrophy. Vascular: Atherosclerotic vascular calcification of the carotid siphons. No hyperdense vessel. Skull: Normal. Negative for fracture or focal lesion. Sinuses/Orbits: No acute finding. Other: Small right posterior scalp hematoma again noted. IMPRESSION: 1. No acute intracranial abnormality. 2. Unchanged small right posterior scalp hematoma. Electronically Signed   By: Obie Dredge M.D.   On: 07/05/2021 11:19   CT Cervical Spine Wo Contrast  Result Date: 07/06/2021 CLINICAL DATA:  Status post fall.   Increased confusion. EXAM: CT HEAD WITHOUT CONTRAST CT CERVICAL SPINE WITHOUT CONTRAST TECHNIQUE: Multidetector CT imaging of the head and cervical spine was performed following the standard protocol without intravenous contrast. Multiplanar CT image reconstructions of the cervical spine were also generated. COMPARISON:  CT head 07/05/2021. FINDINGS: CT HEAD FINDINGS Brain: Cerebral ventricle sizes are concordant with the degree of cerebral volume loss. No evidence of large-territorial acute infarction. No parenchymal hemorrhage. No mass lesion. No extra-axial collection. No mass effect or midline shift. No hydrocephalus. Basilar cisterns are patent. Vascular: No hyperdense vessel. Atherosclerotic calcifications are present within the cavernous internal carotid arteries. Skull: No acute fracture or  focal lesion. Sinuses/Orbits: Paranasal sinuses and mastoid air cells are clear. The orbits are unremarkable. Other: In stray shin of a mild right parieto-occipital scalp edema and 6 mm hematoma. No new hematoma formation. CT CERVICAL SPINE FINDINGS Alignment: Normal. Skull base and vertebrae: Multilevel moderate degenerative changes of the spine. No acute fracture. No aggressive appearing focal osseous lesion or focal pathologic process. Soft tissues and spinal canal: No prevertebral fluid or swelling. No visible canal hematoma. Upper chest: Unremarkable. Other: None. IMPRESSION: 1. No acute intracranial abnormality. 2. No acute displaced fracture or traumatic listhesis of the cervical spine. Electronically Signed   By: Tish Frederickson M.D.   On: 07/06/2021 23:29   DG Chest Portable 1 View  Result Date: 07/07/2021 CLINICAL DATA:  Leukocytosis. EXAM: PORTABLE CHEST 1 VIEW COMPARISON:  Two days ago FINDINGS: Normal heart size and mediastinal contours. There is no edema, consolidation, effusion, or pneumothorax. No acute osseous finding. IMPRESSION: No evidence of acute disease. Electronically Signed   By: Marnee Spring M.D.   On: 07/07/2021 07:01   DG Chest Portable 1 View  Result Date: 07/05/2021 CLINICAL DATA:  Weakness.  Unwitnessed fall EXAM: PORTABLE CHEST 1 VIEW COMPARISON:  06/18/2021 FINDINGS: The heart size and mediastinal contours are within normal limits. Atherosclerotic calcification of the aortic knob. Skin fold overlies the left hemithorax. No focal airspace consolidation, pleural effusion, or pneumothorax. The visualized skeletal structures are unremarkable. IMPRESSION: No active disease. Electronically Signed   By: Duanne Guess D.O.   On: 07/05/2021 13:02     Assessment/Plan Principal Problem:   Rhabdomyolysis Active Problems:   AMS (altered mental status)   Hypokalemia   Frequent falls   Hypothyroidism      Rhabdomyolysis Traumatic following multiple falls IV fluid hydration Repeat CK levels in a.m. Hold statins    Altered mental status Patient is oriented only to person and place which according to the daughter is not his baseline and has been progressively worsening over the last couple of months Dementia work-up MRI of the brain to rule out an acute stroke    Frequent falls Place patient on fall precautions PT evaluation    Hypokalemia Secondary to diuretic therapy Supplement potassium    Hypothyroidism Continue Synthroid    Hypertension Continue metoprolol as well as lisinopril/HCTZ    Nicotine dependence Smoking cessation has been discussed with patient in detail Will place patient on nicotine transdermal patch 14 mg daily  DVT prophylaxis: Lovenox  Code Status: full code  Family Communication: Called and left voicemail for patient's daughter, Ms Levonne Hubert.  Awaiting callback. Disposition Plan: Back to previous home environment Consults called: Physical therapy Status: Observation    Mykai Wendorf MD [facility]     07/07/2021, 9:02 AM

## 2021-07-07 NOTE — Progress Notes (Signed)
PT Cancellation Note  Patient Details Name: William Schwartz MRN: [id number] DOB: 22-Dec-1945   Cancelled Treatment:    Reason Eval/Treat Not Completed: Patient not medically ready PT orders receive, chart reviewed. Pt noted to have low K+ (2.8) with pt receiving IV supplemental K & spoke with MD re: holding PT evaluation for a later time. Due to pt's low K+, exertional activity not recommended at this time. Will f/u as able & as pt is appropriate.  Aleda Grana, PT, DPT 07/07/21, 10:31 AM    Sandi Mariscal 07/07/2021, 10:30 AM

## 2021-07-07 NOTE — ED Notes (Signed)
Sat pt up in bed. Assisted him in eating breakfast. Pt needed a lot of assistance. Admin meds in applesauce.

## 2021-07-07 NOTE — ED Notes (Signed)
Patient returned to ED from CT. Fluids resumed.

## 2021-07-07 NOTE — ED Notes (Signed)
PT moved to hospital bed in c-pod. Pt is alert, oriented to self and situation. Call bell in reach, urinal in reach. Pt placed on cardiac monitor.

## 2021-07-07 NOTE — ED Notes (Signed)
Informed RN bed assigned 

## 2021-07-07 NOTE — ED Notes (Signed)
Pt assisted to bathroom, urinated, bed alarm turned back on

## 2021-07-07 NOTE — ED Notes (Signed)
Provided po fluids, repositioned in bed

## 2021-07-07 NOTE — ED Notes (Signed)
Pt cleaned of stool and placed in brief. Linens changed.

## 2021-07-07 NOTE — ED Notes (Signed)
Patient transported to CT 

## 2021-07-08 ENCOUNTER — Inpatient Hospital Stay: Payer: No Typology Code available for payment source

## 2021-07-08 DIAGNOSIS — E876 Hypokalemia: Secondary | ICD-10-CM

## 2021-07-08 DIAGNOSIS — T796XXD Traumatic ischemia of muscle, subsequent encounter: Secondary | ICD-10-CM

## 2021-07-08 DIAGNOSIS — R410 Disorientation, unspecified: Secondary | ICD-10-CM | POA: Diagnosis not present

## 2021-07-08 DIAGNOSIS — R296 Repeated falls: Secondary | ICD-10-CM

## 2021-07-08 LAB — CBC
HCT: 36.1 % — ABNORMAL LOW (ref 39.0–52.0)
Hemoglobin: 12.3 g/dL — ABNORMAL LOW (ref 13.0–17.0)
MCH: 32.1 pg (ref 26.0–34.0)
MCHC: 34.1 g/dL (ref 30.0–36.0)
MCV: 94.3 fL (ref 80.0–100.0)
Platelets: 240 10*3/uL (ref 150–400)
RBC: 3.83 MIL/uL — ABNORMAL LOW (ref 4.22–5.81)
RDW: 14.5 % (ref 11.5–15.5)
WBC: 8.6 10*3/uL (ref 4.0–10.5)
nRBC: 0 % (ref 0.0–0.2)

## 2021-07-08 LAB — BASIC METABOLIC PANEL
Anion gap: 5 (ref 5–15)
BUN: 14 mg/dL (ref 8–23)
CO2: 31 mmol/L (ref 22–32)
Calcium: 8.2 mg/dL — ABNORMAL LOW (ref 8.9–10.3)
Chloride: 104 mmol/L (ref 98–111)
Creatinine, Ser: 0.8 mg/dL (ref 0.61–1.24)
GFR, Estimated: >60 mL/min (ref >60)
Glucose, Bld: 83 mg/dL (ref 70–99)
Potassium: 3.5 mmol/L (ref 3.5–5.1)
Sodium: 140 mmol/L (ref 135–145)

## 2021-07-08 LAB — VITAMIN B12: Vitamin B-12: 195 pg/mL (ref 180–914)

## 2021-07-08 LAB — RPR: RPR Ser Ql: NONREACTIVE

## 2021-07-08 MED ORDER — DEXTROSE-NACL 5-0.9 % IV SOLN: INTRAVENOUS | Status: DC

## 2021-07-08 NOTE — Plan of Care (Signed)

## 2021-07-08 NOTE — Progress Notes (Signed)
LMOM for William Schwartz to return call.

## 2021-07-08 NOTE — Evaluation (Signed)
Physical Therapy Evaluation Patient Details Name: William Schwartz MRN: [id number] DOB: 02/24/1946 Today's Date: 07/08/2021   History of Present Illness  Pt is a 75 y/o M who presented 2/2 unwitnessed fall last evening (pt rolled out of bed & landed on his chest & had been lying on the floor all night. Pt has presented to ED multiple times over the past few days 2/2 frequent falls. Imaging is negative for acute changes. PMH: HTN  Clinical Impression  Pt seen for PT evaluation with pt pleasantly confused & internally distracted throughout session but PT is able to redirect him throughout. Pt demonstrates LUE weakness that impairs mobility (unable to describe what caused weakness) so pt requires mod assist for supine>sit. Pt is able to complete sit>stand transfers with min/mod assist & ambulate into hallway with min assist with RW. Pt demonstrates decreased safety awareness & use of AD with PT attempting to cue pt throughout. Attempted to assist pt with calling daughter but unable to dial long distance. Pt would benefit from continued skilled PT to address balance, safety with mobility, & gait with LRAD. Pt would benefit from STR upon d/c to maximize independence with functional mobility & reduce fall risk prior to return home.     Follow Up Recommendations SNF;Supervision/Assistance - 24 hour    Equipment Recommendations  Rolling walker with 5" wheels    Recommendations for Other Services       Precautions / Restrictions Precautions Precautions: Fall Restrictions Weight Bearing Restrictions: No      Mobility  Bed Mobility Overal bed mobility: Needs Assistance Bed Mobility: Supine to Sit     Supine to sit: Mod assist     General bed mobility comments: cuing to push with UE to upright trunk    Transfers Overall transfer level: Needs assistance Equipment used: Rolling walker (2 wheeled) Transfers: Sit to/from Stand Sit to Stand: Min assist         General transfer comment:  Poor awareness of safe hand placement as pt pulls up on RW despite PT attempting to cue him to push to standing. Poor eccentric control with lowering stand>sit.  Ambulation/Gait Ambulation/Gait assistance: Min assist Gait Distance (Feet): 45 Feet Assistive device: Rolling walker (2 wheeled) Gait Pattern/deviations: Decreased step length - right;Decreased step length - left;Decreased stride length;Decreased dorsiflexion - right;Decreased dorsiflexion - left Gait velocity: decreased   General Gait Details: Poor safety awareness as pt does not ambulate within base of AD despite PT attempting to cue him. When ambulating in room, pt lets go of walker and holds to bed instead with PT educating him to hold to RW.  Stairs            Wheelchair Mobility    Modified Rankin (Stroke Patients Only)       Balance Overall balance assessment: Needs assistance Sitting-balance support: Feet supported;Bilateral upper extremity supported Sitting balance-Leahy Scale: Fair     Standing balance support: Bilateral upper extremity supported;During functional activity Standing balance-Leahy Scale: Fair Standing balance comment: min assist gait with RW                             Pertinent Vitals/Pain Pain Assessment: No/denies pain    Home Living Family/patient expects to be discharged to:: Private residence                 Additional Comments: Pt unable to provide PLOF information, Pt does endorse using 2 SPCs for gait.  Prior Function           Comments: Pt reports ambulating with 2 SPCs prior to admission.     Hand Dominance        Extremity/Trunk Assessment   Upper Extremity Assessment Upper Extremity Assessment:  (Pt demonstrates impaired L shoulder flexion & coordination. Uses RUE to assist LUE to wipe eye with L hand.)    Lower Extremity Assessment Lower Extremity Assessment: Generalized weakness (Pt with slightly impaired coordination LLE.)        Communication   Communication: No difficulties  Cognition Arousal/Alertness: Awake/alert Behavior During Therapy: WFL for tasks assessed/performed Overall Cognitive Status: No family/caregiver present to determine baseline cognitive functioning                                 General Comments: Pt AxO to self & location, unaware of year & reason for hospitalization. Decreased awareness of safety, impaired memory/recall.      General Comments General comments (skin integrity, edema, etc.): HR 58 bpm at end of session    Exercises     Assessment/Plan    PT Assessment Patient needs continued PT services  PT Problem List Decreased strength;Decreased mobility;Decreased safety awareness;Decreased coordination;Decreased activity tolerance;Decreased balance;Decreased knowledge of use of DME;Decreased cognition       PT Treatment Interventions DME instruction;Therapeutic activities;Cognitive remediation;Modalities;Gait training;Therapeutic exercise;Patient/family education;Stair training;Balance training;Functional mobility training;Neuromuscular re-education;Manual techniques    PT Goals (Current goals can be found in the Care Plan section)  Acute Rehab PT Goals Patient Stated Goal: to call his daughter, get key to his son-in-law PT Goal Formulation: With patient Time For Goal Achievement: 07/22/21 Potential to Achieve Goals: Fair    Frequency Min 2X/week   Barriers to discharge Decreased caregiver support;Inaccessible home environment      Co-evaluation               AM-PAC PT "6 Clicks" Mobility  Outcome Measure Help needed turning from your back to your side while in a flat bed without using bedrails?: A Little Help needed moving from lying on your back to sitting on the side of a flat bed without using bedrails?: A Lot Help needed moving to and from a bed to a chair (including a wheelchair)?: A Lot Help needed standing up from a chair using your arms  (e.g., wheelchair or bedside chair)?: A Lot Help needed to walk in hospital room?: A Lot Help needed climbing 3-5 steps with a railing? : Total 6 Click Score: 12    End of Session Equipment Utilized During Treatment: Gait belt Activity Tolerance: Patient tolerated treatment well Patient left: in chair;with call bell/phone within reach;with chair alarm set (BUE mittens donned) Nurse Communication: Mobility status PT Visit Diagnosis: Unsteadiness on feet (R26.81);Muscle weakness (generalized) (M62.81);History of falling (Z91.81)    Time: 9163-8466 PT Time Calculation (min) (ACUTE ONLY): 19 min   Charges:   PT Evaluation $PT Eval Moderate Complexity: 1 Mod PT Treatments $Therapeutic Activity: 8-22 mins        Aleda Grana, PT, DPT 07/08/21, 10:48 AM   Sandi Mariscal 07/08/2021, 10:45 AM

## 2021-07-08 NOTE — Progress Notes (Signed)
No return call from daughter Jacklynn Barnacle, [place])

## 2021-07-08 NOTE — Progress Notes (Signed)
Tiffany, the daughter and POA, returned call.  Tiffany verbalized understanding and appreciative for update.

## 2021-07-08 NOTE — Progress Notes (Signed)
   07/08/21 1400  What Happened  Was fall witnessed? No  Was patient injured? No  Patient found on floor  Found by Staff-comment Financial planner)  Stated prior activity other (comment) (patient states he rolle dout of bed)  Follow Up  MD notified Dr Marylu Lund  Time MD notified [phone]  Additional tests Yes-comment (CT head)  Progress note created (see row info) Yes  Adult Fall Risk Assessment  Risk Factor Category (scoring not indicated) High fall risk per protocol (document High fall risk);Fall has occurred during this admission (document High fall risk)  Patient Fall Risk Level High fall risk  Adult Fall Risk Interventions  Required Bundle Interventions *See Row Information* High fall risk - low, moderate, and high requirements implemented  Additional Interventions Use of appropriate toileting equipment (bedpan, BSC, etc.);Room near nurses station;Reorient/diversional activities with confused patients  Screening for Fall Injury Risk (To be completed on HIGH fall risk patients) - Assessing Need for Floor Mats  Risk For Fall Injury- Criteria for Floor Mats Previous fall this admission  Will Implement Floor Mats Yes  Pain Assessment  Pain Scale 0-10  Pain Score 0  Neurological  Neuro (WDL) X  Level of Consciousness Alert  Orientation Level Oriented to person;Oriented to place;Disoriented to time;Disoriented to situation  Cognition Follows commands;Impulsive;Poor attention/concentration;Poor judgement;Poor safety awareness;Memory impairment  Speech Clear  Pupil Assessment  Yes  R Pupil Size (mm) 2  R Pupil Shape Round  R Pupil Reaction Brisk  L Pupil Size (mm) 2  L Pupil Shape Round  L Pupil Reaction Brisk  Additional Pupil Assessments No  Motor Function/Sensation Assessment Motor response;Motor strength  R Hand Grip Moderate  L Hand Grip Moderate   RUE Motor Strength 5  LUE Motor Response Purposeful movement  LUE Sensation Full sensation  LUE Motor Strength 5  RLE Motor Response  Purposeful movement  RLE Sensation Full sensation  RLE Motor Strength 4  LLE Motor Response Purposeful movement  LLE Sensation Full sensation  LLE Motor Strength 4  Neuro Symptoms Forgetful  Glasgow Coma Scale  Eye Opening 4  Best Verbal Response (NON-intubated) 4  Best Motor Response 6  Glasgow Coma Scale Score 14  Musculoskeletal  Musculoskeletal (WDL) X  Assistive Device Front wheel walker;BSC  Generalized Weakness Yes  Weight Bearing Restrictions No  Integumentary  Integumentary (WDL) X  Skin Color Appropriate for ethnicity  Skin Condition Dry  Skin Integrity Abrasion  Abrasion Location Leg;Arm;Head  Abrasion Location Orientation Right;Left  Abrasion Intervention Foam  Skin Turgor Non-tenting

## 2021-07-08 NOTE — Progress Notes (Signed)
PROGRESS NOTE    [name]  OEV:[id number] DOB: [birth year] DOA: 07/06/2021 PCP: [facility]    Brief Narrative:  William Schwartz is a 75 y.o. male with medical history significant for hypertension who has had multiple ER visits for evaluation of falls.  Patient resides in a senior living apartment alone and per daughter has had poor oral intake.  She is also concerned that he has become increasingly confused over the last couple of months and his speech is slurred. Patient has been holding in the ER awaiting evaluation for placement and physical therapy but labs revealed hypokalemia as well as elevated total CK levels and so admission has been requested. I am unable to do review of systems on this patient due to his confusion. Labs show sodium 137, potassium 2.8, chloride 96, bicarb 32, glucose 113, BUN 25, creatinine 1.17, calcium 9.0, magnesium 2.0, alkaline phosphatase 36, albumin 3.6, AST 89, ALT 34, total protein 6.5, direct bilirubin 0.2, indirect bilirubin 0.9, total CK5 175, troponin 14, white count 13.8, hemoglobin 13.7, hematocrit 38.3, MCV 91.8, RDW 14.2, Respiratory viral panel is negative CT scan of the head without contrast/cervical spine CT shows no acute intracranial abnormality.  No acute displaced fracture or traumatic listhesis of the cervical spine. Chest x-ray reviewed by me shows no evidence of acute cardiopulmonary disease.   ED Course: Patient is a 75 year old male who has had multiple ER visits for evaluation of falls and was holding in the ER for placement. Admission is requested for evaluation of worsening confusion, slurred speech, elevated total CK levels and hypokalemia  8/21 MS at baseline per daughter this am. After I saw pt, later nsg called pt rolled himself out of bed. Think hit his head.   Consultants:    Procedures:   Antimicrobials:      Subjective: Pt in mittens. Somewhat confused pleasantly.   Objective: Vitals:   07/07/21  1838 07/07/21 2037 07/08/21 0615 07/08/21 0717  BP: (!) 150/90 132/78 (!) 142/87 (!) 145/86  Pulse: (!) 54 (!) 54 (!) 52 (!) 49  Resp: 16 18 16 18   Temp: 98 F (36.7 C) 98.3 F (36.8 C) (!) 97.4 F (36.3 C) 98.1 F (36.7 C)  TempSrc:    Oral  SpO2: 100% 100% 100% 100%  Weight:      Height:        Intake/Output Summary (Last 24 hours) at 07/08/2021 0857 Last data filed at 07/08/2021 0500 Gross per 24 hour  Intake 140 ml  Output 375 ml  Net -235 ml   Filed Weights   07/06/21 1932  Weight: 80 kg    Examination:  General exam: Appears calm and comfortable  Respiratory system: Clear to auscultation. Respiratory effort normal. Cardiovascular system: S1 & S2 heard, RRR. No JVD, murmurs, rubs, gallops or clicks. Gastrointestinal system: Abdomen is nondistended, soft and nontender. No organomegaly or masses felt. Normal bowel sounds heard. Central nervous system: alert, oriented to place and person only Extremities: no edema Skin:warm, dry     Data Reviewed: I have personally reviewed following labs and imaging studies  CBC: Recent Labs  Lab 07/05/21 1023 07/06/21 1951 07/07/21 1943 07/08/21 0518  WBC 13.7* 13.8* 9.9 8.6  NEUTROABS  --  12.3* 7.9*  --   HGB 15.3 13.7 14.1 12.3*  HCT 42.4 38.3* 40.8 36.1*  MCV 92.0 91.8 92.9 94.3  PLT 313 266 276 240   Basic Metabolic Panel: Recent Labs  Lab 07/05/21 1023 07/06/21 1951 07/06/21 2226  07/07/21 1400 07/07/21 1943 07/08/21 0518  NA 135 137  --  138 138 140  K 3.0* 2.8*  --  3.2* 3.4* 3.5  CL 90* 96*  --  103 102 104  CO2 30 32  --  27 30 31   GLUCOSE 96 113*  --  89 98 83  BUN 14 25*  --  19 18 14   CREATININE 1.01 1.17  --  0.88 0.89 0.80  CALCIUM 8.9 9.0  --  8.2* 8.7* 8.2*  MG  --   --  2.0  --   --   --    GFR: Estimated Creatinine Clearance: 87.6 mL/min (by C-G formula based on SCr of 0.8 mg/dL). Liver Function Tests: Recent Labs  Lab 07/07/21 0612  AST 89*  ALT 34  ALKPHOS 36*  BILITOT 1.1   PROT 6.5  ALBUMIN 3.6   No results for input(s): LIPASE, AMYLASE in the last 168 hours. Recent Labs  Lab 07/07/21 1943  AMMONIA <9*   Coagulation Profile: No results for input(s): INR, PROTIME in the last 168 hours. Cardiac Enzymes: Recent Labs  Lab 07/05/21 1023 07/06/21 2226  CKTOTAL 3,693* 5,175*   BNP (last 3 results) No results for input(s): PROBNP in the last 8760 hours. HbA1C: No results for input(s): HGBA1C in the last 72 hours. CBG: Recent Labs  Lab 07/07/21 0640  GLUCAP 105*   Lipid Profile: No results for input(s): CHOL, HDL, LDLCALC, TRIG, CHOLHDL, LDLDIRECT in the last 72 hours. Thyroid Function Tests: Recent Labs    07/07/21 1943  TSH 102.231*   Anemia Panel: Recent Labs    07/07/21 0900 07/07/21 1943  VITAMINB12  --  195  FOLATE 7.2  --    Sepsis Labs: No results for input(s): PROCALCITON, LATICACIDVEN in the last 168 hours.  Recent Results (from the past 240 hour(s))  Culture, blood (Routine X 2) w Reflex to ID Panel     Status: None (Preliminary result)   Collection Time: 07/07/21  6:12 AM   Specimen: BLOOD  Result Value Ref Range Status   Specimen Description BLOOD RIGHT FOREARM  Final   Special Requests   Final    BOTTLES DRAWN AEROBIC AND ANAEROBIC Blood Culture adequate volume   Culture   Final    NO GROWTH < 24 HOURS Performed at [facility], [address], [place], [address] [place]    Report Status PENDING  Incomplete  Culture, blood (Routine X 2) w Reflex to ID Panel     Status: None (Preliminary result)   Collection Time: 07/07/21  6:12 AM   Specimen: BLOOD  Result Value Ref Range Status   Specimen Description BLOOD LEFT FOREARM  Final   Special Requests   Final    BOTTLES DRAWN AEROBIC AND ANAEROBIC Blood Culture results may not be optimal due to an inadequate volume of blood received in culture bottles   Culture   Final    NO GROWTH < 24 HOURS Performed at [facility], [address],  [place], [address] [place]    Report Status PENDING  Incomplete  Resp Panel by RT-PCR (Flu A&B, Covid) Urine, Clean Catch     Status: None   Collection Time: 07/07/21  6:17 AM   Specimen: Urine, Clean Catch; Nasopharyngeal(NP) swabs in vial transport medium  Result Value Ref Range Status   SARS Coronavirus 2 by RT PCR NEGATIVE NEGATIVE Final    Comment: (NOTE) SARS-CoV-2 target nucleic acids are NOT DETECTED.  The SARS-CoV-2 RNA  is generally detectable in upper respiratory specimens during the acute phase of infection. The lowest concentration of SARS-CoV-2 viral copies this assay can detect is 138 copies/mL. A negative result does not preclude SARS-Cov-2 infection and should not be used as the sole basis for treatment or other patient management decisions. A negative result may occur with  improper specimen collection/handling, submission of specimen other than nasopharyngeal swab, presence of viral mutation(s) within the areas targeted by this assay, and inadequate number of viral copies(<138 copies/mL). A negative result must be combined with clinical observations, patient history, and epidemiological information. The expected result is Negative.  Fact Sheet for Patients:  BloggerCourse.com  Fact Sheet for Healthcare Providers:  SeriousBroker.it  This test is no t yet approved or cleared by the [place] FDA and  has been authorized for detection and/or diagnosis of SARS-CoV-2 by FDA under an Emergency Use Authorization (EUA). This EUA will remain  in effect (meaning this test can be used) for the duration of the COVID-19 declaration under Section 564(b)(1) of the Act, 21 U.S.C.section 360bbb-3(b)(1), unless the authorization is terminated  or revoked sooner.       Influenza A by PCR NEGATIVE NEGATIVE Final   Influenza B by PCR NEGATIVE NEGATIVE Final    Comment: (NOTE) The Xpert Xpress SARS-CoV-2/FLU/RSV plus assay is  intended as an aid in the diagnosis of influenza from Nasopharyngeal swab specimens and should not be used as a sole basis for treatment. Nasal washings and aspirates are unacceptable for Xpert Xpress SARS-CoV-2/FLU/RSV testing.  Fact Sheet for Patients: BloggerCourse.com  Fact Sheet for Healthcare Providers: SeriousBroker.it  This test is not yet approved or cleared by the [place] FDA and has been authorized for detection and/or diagnosis of SARS-CoV-2 by FDA under an Emergency Use Authorization (EUA). This EUA will remain in effect (meaning this test can be used) for the duration of the COVID-19 declaration under Section 564(b)(1) of the Act, 21 U.S.C. section 360bbb-3(b)(1), unless the authorization is terminated or revoked.  Performed at [facility], [address], [place], [place] [address]          Radiology Studies: CT HEAD WO CONTRAST ( )  Result Date: 07/06/2021 CLINICAL DATA:  Status post fall.  Increased confusion. EXAM: CT HEAD WITHOUT CONTRAST CT CERVICAL SPINE WITHOUT CONTRAST TECHNIQUE: Multidetector CT imaging of the head and cervical spine was performed following the standard protocol without intravenous contrast. Multiplanar CT image reconstructions of the cervical spine were also generated. COMPARISON:  CT head 07/05/2021. FINDINGS: CT HEAD FINDINGS Brain: Cerebral ventricle sizes are concordant with the degree of cerebral volume loss. No evidence of large-territorial acute infarction. No parenchymal hemorrhage. No mass lesion. No extra-axial collection. No mass effect or midline shift. No hydrocephalus. Basilar cisterns are patent. Vascular: No hyperdense vessel. Atherosclerotic calcifications are present within the cavernous internal carotid arteries. Skull: No acute fracture or focal lesion. Sinuses/Orbits: Paranasal sinuses and mastoid air cells are clear. The orbits are unremarkable. Other: In  stray shin of a mild right parieto-occipital scalp edema and 6 mm hematoma. No new hematoma formation. CT CERVICAL SPINE FINDINGS Alignment: Normal. Skull base and vertebrae: Multilevel moderate degenerative changes of the spine. No acute fracture. No aggressive appearing focal osseous lesion or focal pathologic process. Soft tissues and spinal canal: No prevertebral fluid or swelling. No visible canal hematoma. Upper chest: Unremarkable. Other: None. IMPRESSION: 1. No acute intracranial abnormality. 2. No acute displaced fracture or traumatic listhesis of the cervical spine. Electronically Signed   By:  Tish Frederickson M.D.   On: 07/06/2021 23:29   CT Cervical Spine Wo Contrast  Result Date: 07/06/2021 CLINICAL DATA:  Status post fall.  Increased confusion. EXAM: CT HEAD WITHOUT CONTRAST CT CERVICAL SPINE WITHOUT CONTRAST TECHNIQUE: Multidetector CT imaging of the head and cervical spine was performed following the standard protocol without intravenous contrast. Multiplanar CT image reconstructions of the cervical spine were also generated. COMPARISON:  CT head 07/05/2021. FINDINGS: CT HEAD FINDINGS Brain: Cerebral ventricle sizes are concordant with the degree of cerebral volume loss. No evidence of large-territorial acute infarction. No parenchymal hemorrhage. No mass lesion. No extra-axial collection. No mass effect or midline shift. No hydrocephalus. Basilar cisterns are patent. Vascular: No hyperdense vessel. Atherosclerotic calcifications are present within the cavernous internal carotid arteries. Skull: No acute fracture or focal lesion. Sinuses/Orbits: Paranasal sinuses and mastoid air cells are clear. The orbits are unremarkable. Other: In stray shin of a mild right parieto-occipital scalp edema and 6 mm hematoma. No new hematoma formation. CT CERVICAL SPINE FINDINGS Alignment: Normal. Skull base and vertebrae: Multilevel moderate degenerative changes of the spine. No acute fracture. No aggressive  appearing focal osseous lesion or focal pathologic process. Soft tissues and spinal canal: No prevertebral fluid or swelling. No visible canal hematoma. Upper chest: Unremarkable. Other: None. IMPRESSION: 1. No acute intracranial abnormality. 2. No acute displaced fracture or traumatic listhesis of the cervical spine. Electronically Signed   By: Tish Frederickson M.D.   On: 07/06/2021 23:29   MR BRAIN WO CONTRAST  Result Date: 07/07/2021 CLINICAL DATA:  Neuro deficit, acute, stroke suspected EXAM: MRI HEAD WITHOUT CONTRAST TECHNIQUE: Multiplanar, multiecho pulse sequences of the brain and surrounding structures were obtained without intravenous contrast. COMPARISON:  None. FINDINGS: Brain: There is no acute infarction or intracranial hemorrhage. There is no intracranial mass, mass effect, or edema. There is no hydrocephalus or extra-axial fluid collection. Ventricles and sulci are prominent reflecting generalized parenchymal volume loss. Patchy foci of T2 hyperintensity in the supratentorial white matter are nonspecific but may reflect mild chronic microvascular ischemic changes. Vascular: Major vessel flow voids at the skull base are preserved. Skull and upper cervical spine: Normal marrow signal is preserved. Sinuses/Orbits: Minor mucosal thickening. Bilateral lens replacements. Other: Sella is unremarkable.  Bilateral mastoid effusions. IMPRESSION: No evidence of recent infarction, hemorrhage, or mass. Chronic microvascular ischemic changes. Electronically Signed   By: Guadlupe Spanish M.D.   On: 07/07/2021 16:49   DG Chest Portable 1 View  Result Date: 07/07/2021 CLINICAL DATA:  Leukocytosis. EXAM: PORTABLE CHEST 1 VIEW COMPARISON:  Two days ago FINDINGS: Normal heart size and mediastinal contours. There is no edema, consolidation, effusion, or pneumothorax. No acute osseous finding. IMPRESSION: No evidence of acute disease. Electronically Signed   By: Marnee Spring M.D.   On: 07/07/2021 07:01         Scheduled Meds:  aspirin EC  81 mg Oral BH-q7a   enoxaparin (LOVENOX) injection  40 mg Subcutaneous Q24H   lisinopril  20 mg Oral Daily   And   hydrochlorothiazide  12.5 mg Oral Daily   levothyroxine  125 mcg Oral Daily   loratadine  10 mg Oral Daily   metoprolol tartrate  12.5 mg Oral BID   nicotine  14 mg Transdermal Daily   Continuous Infusions:  dextrose 5 % and 0.9% NaCl     sodium chloride      Assessment & Plan:   Principal Problem:   Rhabdomyolysis Active Problems:   AMS (altered mental status)  Hypokalemia   Frequent falls   Hypothyroidism   Rhabdomyolysis Traumatic following multiple falls 8/21- continue with IVF.  Monitor ck Hold statin  Hold hctz for now since on ivf        Altered mental status Patient is oriented only to person and place which according to the daughter is not his baseline and has been progressively worsening over the last couple of months 8/21Needs to f/u with neurology for dementia as outpt MRI without acute findings Per daughter MS at baseline now        Frequent falls Fall precautions 1:1 sit Ct head repeat as he fell from bed, ? Hit head        Hypokalemia Replaced and stable Likely from hctz     Hypothyroidism Continue Synthroid       Hypertension Continue metoprolol Continue lisinopril Hold hctz       Nicotine dependence Smoking cessation has been discussed with patient in detail on nicotine transdermal patch 14 mg daily   DVT prophylaxis: lovenox Code Status:full Family Communication: updated daughter Disposition Plan:  Status is: Inpatient  Remains inpatient appropriate because:Inpatient level of care appropriate due to severity of illness  Dispo: The patient is from: Home              Anticipated d/c is to: SNF              Patient currently is not medically stable to d/c.   Difficult to place patient No            LOS: 1 day   Time spent: 35 min with > 50 % on  coc    Lynn ItoSahar Vallory Oetken, MD [facility] Pager [phone]  If 7PM-7AM, please contact night-coverage 07/08/2021, 8:57 AM

## 2021-07-09 DIAGNOSIS — R296 Repeated falls: Secondary | ICD-10-CM | POA: Diagnosis not present

## 2021-07-09 DIAGNOSIS — E876 Hypokalemia: Secondary | ICD-10-CM | POA: Diagnosis not present

## 2021-07-09 DIAGNOSIS — T796XXD Traumatic ischemia of muscle, subsequent encounter: Secondary | ICD-10-CM | POA: Diagnosis not present

## 2021-07-09 DIAGNOSIS — R410 Disorientation, unspecified: Secondary | ICD-10-CM | POA: Diagnosis not present

## 2021-07-09 LAB — CK: Total CK: 801 U/L — ABNORMAL HIGH (ref 49–397)

## 2021-07-09 MED ORDER — MIDODRINE HCL 5 MG PO TABS
5.0000 mg | ORAL_TABLET | Freq: Three times a day (TID) | ORAL | Status: DC
  Filled 2021-07-09: qty 1

## 2021-07-09 NOTE — NC FL2 (Signed)
[place] MEDICAID FL2 LEVEL OF CARE SCREENING TOOL     IDENTIFICATION  Patient Name: William Schwartz Birthdate: 11-23-1945 Sex: male Admission Date (Current Location): 07/06/2021  [facility] and [place] Number:  Chiropodist and Address:  [facility], [address] [place], [place] [address]      Provider Number: [id number]  Attending Physician Name and Address:  Lynn Ito, MD  Relative Name and Phone Number:  Elmarie Shiley daughter- [phone]    Current Level of Care: Hospital Recommended Level of Care: Skilled Nursing Facility Prior Approval Number:    Date Approved/Denied:   PASRR Number: [id number]  Discharge Plan:      Current Diagnoses: Patient Active Problem List   Diagnosis Date Noted   Rhabdomyolysis 07/07/2021   AMS (altered mental status) 07/07/2021   Hypokalemia 07/07/2021   Frequent falls 07/07/2021   Hypothyroidism 07/07/2021    Orientation RESPIRATION BLADDER Height & Weight     Self, Place  Normal Continent, External catheter Weight: 80 kg Height:  5\' 11"  (180.3 cm)  BEHAVIORAL SYMPTOMS/MOOD NEUROLOGICAL BOWEL NUTRITION STATUS      Continent Diet (2 gram sodium)  AMBULATORY STATUS COMMUNICATION OF NEEDS Skin   Extensive Assist Verbally Normal                       Personal Care Assistance Level of Assistance  Bathing, Dressing Bathing Assistance: Limited assistance   Dressing Assistance: Limited assistance     Functional Limitations Info             SPECIAL CARE FACTORS FREQUENCY  PT (By licensed PT), OT (By licensed OT)     PT Frequency: 5 times per week OT Frequency: 5 times per week            Contractures Contractures Info: Not present    Additional Factors Info  Code Status, Allergies Code Status Info: Fullcode Allergies Info: NKDA           Current Medications (07/09/2021):  This is the current hospital active medication list Current Facility-Administered Medications   Medication Dose Route Frequency Provider Last Rate Last Admin   acetaminophen (TYLENOL) tablet 650 mg  650 mg Oral Q6H PRN Agbata, Tochukwu, MD   650 mg at 07/08/21 2111   Or   acetaminophen (TYLENOL) suppository 650 mg  650 mg Rectal Q6H PRN Agbata, Tochukwu, MD       aspirin EC tablet 81 mg  81 mg Oral BH-q7a Agbata, Tochukwu, MD   81 mg at 07/09/21 0945   dextrose 5 %-0.9 % sodium chloride infusion   Intravenous Continuous 07/11/21, MD   Stopped at 07/09/21 1012   enoxaparin (LOVENOX) injection 40 mg  40 mg Subcutaneous Q24H Agbata, Tochukwu, MD   40 mg at 07/09/21 0946   levothyroxine (SYNTHROID) tablet 125 mcg  125 mcg Oral Daily Agbata, Tochukwu, MD   125 mcg at 07/09/21 0532   loratadine (CLARITIN) tablet 10 mg  10 mg Oral Daily Agbata, Tochukwu, MD   10 mg at 07/09/21 0945   metoprolol tartrate (LOPRESSOR) tablet 12.5 mg  12.5 mg Oral BID 07/11/21, MD   12.5 mg at 07/08/21 2112   midodrine (PROAMATINE) tablet 5 mg  5 mg Oral TID WC 2113, MD       nicotine (NICODERM CQ - dosed in mg/24 hours) patch 14 mg  14 mg Transdermal Daily Agbata, Tochukwu, MD   14 mg at 07/09/21 0947   ondansetron (ZOFRAN)  tablet 4 mg  4 mg Oral Q6H PRN Agbata, Tochukwu, MD       Or   ondansetron (ZOFRAN) injection 4 mg  4 mg Intravenous Q6H PRN Agbata, Tochukwu, MD       sodium chloride 0.9 % bolus 1,000 mL  1,000 mL Intravenous Once Ward, Layla Maw, DO         Discharge Medications: Please see discharge summary for a list of discharge medications.  Relevant Imaging Results:  Relevant Lab Results:   Additional Information SS# [id number]  Barrie Dunker, RN

## 2021-07-09 NOTE — TOC Progression Note (Addendum)
Transition of Care [facility]) - Progression Note    Patient Details  Name: William Schwartz MRN: [id number] Date of Birth: Jul 21, 1946  Transition of Care [facility]) CM/SW Contact  Barrie Dunker, RN Phone Number: 07/09/2021, 3:59 PM  Clinical Narrative:     William Schwartz care to verify benefit to go to SNF, They stated that his member ID is not correct, they provided  ICN number [id number] [phone] and stated that number would need to be used for claims, they could not verify that the member has STR, SNF benefit, I called [facility] and left 3 separate messages through out the day requesting a call back with verification of STR SNF benefit, provided the patient's name DOB and SS# on a secure VM, awaiitng a call back, bed search sent in the meantime to look for a bed for STR  Daughter William Schwartz has a copy of his [facility] card and the member id number is [id number] per our conversation, she is not sure if he is covered for STR SNF but stated that he also has medicare       Expected Discharge Plan and Services                                                 Social Determinants of Health (SDOH) Interventions    Readmission Risk Interventions No flowsheet data found.

## 2021-07-09 NOTE — Progress Notes (Signed)
PROGRESS NOTE    William Schwartz  DGL:[id number] DOB: Aug 02, 1946 DOA: 07/06/2021 PCP: [facility]    Brief Narrative:  William Schwartz is a 75 y.o. male with medical history significant for hypertension who has had multiple ER visits for evaluation of falls.  Patient resides in a senior living apartment alone and per daughter has had poor oral intake.  She is also concerned that he has become increasingly confused over the last couple of months and his speech is slurred. Patient has been holding in the ER awaiting evaluation for placement and physical therapy but labs revealed hypokalemia as well as elevated total CK levels and so admission has been requested. I am unable to do review of systems on this patient due to his confusion. Labs show sodium 137, potassium 2.8, chloride 96, bicarb 32, glucose 113, BUN 25, creatinine 1.17, calcium 9.0, magnesium 2.0, alkaline phosphatase 36, albumin 3.6, AST 89, ALT 34, total protein 6.5, direct bilirubin 0.2, indirect bilirubin 0.9, total CK5 175, troponin 14, white count 13.8, hemoglobin 13.7, hematocrit 38.3, MCV 91.8, RDW 14.2, Respiratory viral panel is negative CT scan of the head without contrast/cervical spine CT shows no acute intracranial abnormality.  No acute displaced fracture or traumatic listhesis of the cervical spine. Chest x-ray reviewed by me shows no evidence of acute cardiopulmonary disease.   ED Course: Patient is a 75 year old male who has had multiple ER visits for evaluation of falls and was holding in the ER for placement. Admission is requested for evaluation of worsening confusion, slurred speech, elevated total CK levels and hypokalemia  8/21 MS at baseline per daughter this am. After I saw pt, later nsg called pt rolled himself out of bed. Think hit his head.  8/22 sitter at bedside.  No overnight issues.  CT of the head from yesterday's fall negative with any kind of hemorrhaging   Consultants:     Procedures:   Antimicrobials:      Subjective: Patient pleasant today.  At baseline confusion.  Sitter at bedside.  Was told had good p.o. intake.  He denies any shortness of breath or chest pain.  Objective: Vitals:   07/08/21 1843 07/08/21 2313 07/09/21 0413 07/09/21 0742  BP: (!) 165/95 135/69 (!) 106/57 114/68  Pulse: 60 (!) 54 64 (!) 52  Resp: 20 15 14 20   Temp: 98.1 F (36.7 C) 98.2 F (36.8 C) 98 F (36.7 C) 98.5 F (36.9 C)  TempSrc: Oral     SpO2: 100% 97% 100% 99%  Weight:      Height:       No intake or output data in the 24 hours ending 07/09/21 0842  Filed Weights   07/06/21 1932  Weight: 80 kg    Examination: Nad, calm Cta no w/r/r Regular s1/s2 no gallop Soft benign +bs No edema Confused, grossly intact    Data Reviewed: I have personally reviewed following labs and imaging studies  CBC: Recent Labs  Lab 07/05/21 1023 07/06/21 1951 07/07/21 1943 07/08/21 0518  WBC 13.7* 13.8* 9.9 8.6  NEUTROABS  --  12.3* 7.9*  --   HGB 15.3 13.7 14.1 12.3*  HCT 42.4 38.3* 40.8 36.1*  MCV 92.0 91.8 92.9 94.3  PLT 313 266 276 240   Basic Metabolic Panel: Recent Labs  Lab 07/05/21 1023 07/06/21 1951 07/06/21 2226 07/07/21 1400 07/07/21 1943 07/08/21 0518  NA 135 137  --  138 138 140  K 3.0* 2.8*  --  3.2* 3.4* 3.5  CL 90* 96*  --  103 102 104  CO2 30 32  --  27 30 31   GLUCOSE 96 113*  --  89 98 83  BUN 14 25*  --  19 18 14   CREATININE 1.01 1.17  --  0.88 0.89 0.80  CALCIUM 8.9 9.0  --  8.2* 8.7* 8.2*  MG  --   --  2.0  --   --   --    GFR: Estimated Creatinine Clearance: 87.6 mL/min (by C-G formula based on SCr of 0.8 mg/dL). Liver Function Tests: Recent Labs  Lab 07/07/21 0612  AST 89*  ALT 34  ALKPHOS 36*  BILITOT 1.1  PROT 6.5  ALBUMIN 3.6   No results for input(s): LIPASE, AMYLASE in the last 168 hours. Recent Labs  Lab 07/07/21 1943  AMMONIA <9*   Coagulation Profile: No results for input(s): INR, PROTIME in the  last 168 hours. Cardiac Enzymes: Recent Labs  Lab 07/05/21 1023 07/06/21 2226  CKTOTAL 3,693* 5,175*   BNP (last 3 results) No results for input(s): PROBNP in the last 8760 hours. HbA1C: No results for input(s): HGBA1C in the last 72 hours. CBG: Recent Labs  Lab 07/07/21 0640  GLUCAP 105*   Lipid Profile: No results for input(s): CHOL, HDL, LDLCALC, TRIG, CHOLHDL, LDLDIRECT in the last 72 hours. Thyroid Function Tests: Recent Labs    07/07/21 1943  TSH 102.231*   Anemia Panel: Recent Labs    07/07/21 0900 07/07/21 1943  VITAMINB12  --  195  FOLATE 7.2  --    Sepsis Labs: No results for input(s): PROCALCITON, LATICACIDVEN in the last 168 hours.  Recent Results (from the past 240 hour(s))  Culture, blood (Routine X 2) w Reflex to ID Panel     Status: None (Preliminary result)   Collection Time: 07/07/21  6:12 AM   Specimen: BLOOD  Result Value Ref Range Status   Specimen Description BLOOD RIGHT FOREARM  Final   Special Requests   Final    BOTTLES DRAWN AEROBIC AND ANAEROBIC Blood Culture adequate volume   Culture   Final    NO GROWTH 2 DAYS Performed at [facility], [address] [place], [place] [address]    Report Status PENDING  Incomplete  Culture, blood (Routine X 2) w Reflex to ID Panel     Status: None (Preliminary result)   Collection Time: 07/07/21  6:12 AM   Specimen: BLOOD  Result Value Ref Range Status   Specimen Description BLOOD LEFT FOREARM  Final   Special Requests   Final    BOTTLES DRAWN AEROBIC AND ANAEROBIC Blood Culture results may not be optimal due to an inadequate volume of blood received in culture bottles   Culture   Final    NO GROWTH 2 DAYS Performed at [facility], [address] [place], [place] [address]    Report Status PENDING  Incomplete  Resp Panel by RT-PCR (Flu A&B, Covid) Urine, Clean Catch     Status: None   Collection Time: 07/07/21  6:17 AM   Specimen: Urine, Clean Catch; Nasopharyngeal(NP)  swabs in vial transport medium  Result Value Ref Range Status   SARS Coronavirus 2 by RT PCR NEGATIVE NEGATIVE Final    Comment: (NOTE) SARS-CoV-2 target nucleic acids are NOT DETECTED.  The SARS-CoV-2 RNA is generally detectable in upper respiratory specimens during the acute phase of infection. The lowest concentration of SARS-CoV-2 viral copies this assay can detect is 138 copies/mL. A negative  result does not preclude SARS-Cov-2 infection and should not be used as the sole basis for treatment or other patient management decisions. A negative result may occur with  improper specimen collection/handling, submission of specimen other than nasopharyngeal swab, presence of viral mutation(s) within the areas targeted by this assay, and inadequate number of viral copies(<138 copies/mL). A negative result must be combined with clinical observations, patient history, and epidemiological information. The expected result is Negative.  Fact Sheet for Patients:  BloggerCourse.com  Fact Sheet for Healthcare Providers:  SeriousBroker.it  This test is no t yet approved or cleared by the [place] FDA and  has been authorized for detection and/or diagnosis of SARS-CoV-2 by FDA under an Emergency Use Authorization (EUA). This EUA will remain  in effect (meaning this test can be used) for the duration of the COVID-19 declaration under Section 564(b)(1) of the Act, 21 U.S.C.section 360bbb-3(b)(1), unless the authorization is terminated  or revoked sooner.       Influenza A by PCR NEGATIVE NEGATIVE Final   Influenza B by PCR NEGATIVE NEGATIVE Final    Comment: (NOTE) The Xpert Xpress SARS-CoV-2/FLU/RSV plus assay is intended as an aid in the diagnosis of influenza from Nasopharyngeal swab specimens and should not be used as a sole basis for treatment. Nasal washings and aspirates are unacceptable for Xpert Xpress  SARS-CoV-2/FLU/RSV testing.  Fact Sheet for Patients: BloggerCourse.com  Fact Sheet for Healthcare Providers: SeriousBroker.it  This test is not yet approved or cleared by the [place] FDA and has been authorized for detection and/or diagnosis of SARS-CoV-2 by FDA under an Emergency Use Authorization (EUA). This EUA will remain in effect (meaning this test can be used) for the duration of the COVID-19 declaration under Section 564(b)(1) of the Act, 21 U.S.C. section 360bbb-3(b)(1), unless the authorization is terminated or revoked.  Performed at [facility], [address] [place], [place] [address]          Radiology Studies: CT HEAD WO CONTRAST ( )  Result Date: 07/08/2021 CLINICAL DATA:  Fall, head trauma EXAM: CT HEAD WITHOUT CONTRAST TECHNIQUE: Contiguous axial images were obtained from the base of the skull through the vertex without intravenous contrast. COMPARISON:  07/06/2021 FINDINGS: Brain: There is atrophy and chronic small vessel disease changes. No acute intracranial abnormality. Specifically, no hemorrhage, hydrocephalus, mass lesion, acute infarction, or significant intracranial injury. Vascular: No hyperdense vessel or unexpected calcification. Skull: No acute calvarial abnormality. Sinuses/Orbits: No acute findings Other: None IMPRESSION: Atrophy, chronic microvascular disease. No acute intracranial abnormality. Electronically Signed   By: Charlett Nose M.D.   On: 07/08/2021 17:04   MR BRAIN WO CONTRAST  Result Date: 07/07/2021 CLINICAL DATA:  Neuro deficit, acute, stroke suspected EXAM: MRI HEAD WITHOUT CONTRAST TECHNIQUE: Multiplanar, multiecho pulse sequences of the brain and surrounding structures were obtained without intravenous contrast. COMPARISON:  None. FINDINGS: Brain: There is no acute infarction or intracranial hemorrhage. There is no intracranial mass, mass effect, or edema. There is no  hydrocephalus or extra-axial fluid collection. Ventricles and sulci are prominent reflecting generalized parenchymal volume loss. Patchy foci of T2 hyperintensity in the supratentorial white matter are nonspecific but may reflect mild chronic microvascular ischemic changes. Vascular: Major vessel flow voids at the skull base are preserved. Skull and upper cervical spine: Normal marrow signal is preserved. Sinuses/Orbits: Minor mucosal thickening. Bilateral lens replacements. Other: Sella is unremarkable.  Bilateral mastoid effusions. IMPRESSION: No evidence of recent infarction, hemorrhage, or mass. Chronic microvascular ischemic changes. Electronically Signed  By: Guadlupe Spanish M.D.   On: 07/07/2021 16:49        Scheduled Meds:  aspirin EC  81 mg Oral [id number]   enoxaparin (LOVENOX) injection  40 mg Subcutaneous Q24H   levothyroxine  125 mcg Oral Daily   lisinopril  20 mg Oral Daily   loratadine  10 mg Oral Daily   metoprolol tartrate  12.5 mg Oral BID   nicotine  14 mg Transdermal Daily   Continuous Infusions:  dextrose 5 % and 0.9% NaCl 75 mL/hr at 07/09/21 0243   sodium chloride      Assessment & Plan:   Principal Problem:   Rhabdomyolysis Active Problems:   AMS (altered mental status)   Hypokalemia   Frequent falls   Hypothyroidism   Rhabdomyolysis Traumatic following multiple falls 8/22 CK trending down Continue IV fluids Hold HCTZ Continue to hold statin      Altered mental status Patient is oriented only to person and place which according to the daughter is not his baseline and has been progressively worsening over the last couple of months 8/21Needs to f/u with neurology for dementia as outpt MRI without acute findings 8/22 per daughter MS at baseline now        Frequent falls Fall precautions 1:1 sit 8/22 CT of the head repeat was negative for acute abnormality          Hypokalemia Was replaced and stable.  Likely from HCTZ       Hypothyroidism Continue Synthroid       Hypertension Continue metoprolol Continue lisinopril Hold hctz       Nicotine dependence Smoking cessation has been discussed with patient in detail on nicotine transdermal patch 14 mg daily   DVT prophylaxis: lovenox Code Status:full Family Communication: updated daughter Disposition Plan:  Status is: Inpatient  Remains inpatient appropriate because:Inpatient level of care appropriate due to severity of illness  Dispo: The patient is from: Home              Anticipated d/c is to: SNF              Patient currently is not medically stable to d/c.   Difficult to place patient No    SNF pending.  Still needs IV fluids.  Has sitter        LOS: 2 days   Time spent: 35 min with > 50 % on coc    Lynn Ito, MD [facility] Pager [phone]  If 7PM-7AM, please contact night-coverage 07/09/2021, 8:42 AM

## 2021-07-09 NOTE — Progress Notes (Signed)
Physical Therapy Treatment Patient Details Name: William Schwartz MRN: [id number] DOB: [birth year] Today's Date: 07/09/2021    History of Present Illness Pt is a 75 y/o M who presented 2/2 unwitnessed fall last evening (pt rolled out of bed & landed on his chest & had been lying on the floor all night. Pt has presented to ED multiple times over the past few days 2/2 frequent falls. Imaging is negative for acute changes. PMH: HTN    PT Comments    Pt agreeable to tx. Pt with limited BUE shoulder mobility limiting independence with functional tasks. Pt requires mod assist for bed mobility, min assist for transfers after PT assists with placing hand on RW. Pt demonstrates impaired gait pattern with decreased ability to follow cuing to progress to more normalized pattern. Pt also requires cuing but has poor demo of safety with RW as he will step outside of it especially when turning. Pt requires cuing to attend to tasks during session. Continue to recommend STR upon d/c to maximize independence with functional mobility & reduce fall risk prior to return home.    Follow Up Recommendations  SNF;Supervision/Assistance - 24 hour     Equipment Recommendations  Rolling walker with 5" wheels    Recommendations for Other Services       Precautions / Restrictions Precautions Precautions: Fall Restrictions Weight Bearing Restrictions: No    Mobility  Bed Mobility Overal bed mobility: Needs Assistance Bed Mobility: Supine to Sit     Supine to sit: Mod assist;HOB elevated     General bed mobility comments: assistance to transfer BLE off EOB, upright trunk (limited BUE shoulder mobility impairing strength)    Transfers Overall transfer level: Needs assistance Equipment used: Rolling walker (2 wheeled) Transfers: Sit to/from Stand Sit to Stand: Min assist         General transfer comment: assistance with placing UE on RW, cuing to push with other UE (poor return demo from  pt)  Ambulation/Gait Ambulation/Gait assistance: Min Chemical engineer (Feet): 70 Feet Assistive device: Rolling walker (2 wheeled) Gait Pattern/deviations: Decreased step length - right;Decreased step length - left;Decreased stride length;Decreased dorsiflexion - right;Decreased dorsiflexion - left Gait velocity: decreased   General Gait Details: Cuing to ambulate within base of AD, poor ability to do so when turning. Decreased heel strike RLE > LLE, decreased step length RLE compared to LLE   Stairs             Wheelchair Mobility    Modified Rankin (Stroke Patients Only)       Balance Overall balance assessment: Needs assistance Sitting-balance support: Feet supported;Bilateral upper extremity supported Sitting balance-Leahy Scale: Fair Sitting balance - Comments: supervision static sitting   Standing balance support: Bilateral upper extremity supported;During functional activity Standing balance-Leahy Scale: Poor Standing balance comment: requires BUE support on RW                            Cognition Arousal/Alertness: Awake/alert Behavior During Therapy:  (internally distracted & tangential in speech requiring cuing to attend to task at hand) Overall Cognitive Status: No family/caregiver present to determine baseline cognitive functioning                                 General Comments: oriented to location & hospitalization 2/2 falls, otherwisen not oriented      Exercises  General Comments        Pertinent Vitals/Pain Pain Assessment: Faces Faces Pain Scale: Hurts little more Pain Location: shoulder, hip, generalized Pain Descriptors / Indicators: Sore Pain Intervention(s): Limited activity within patient's tolerance;Monitored during session    Home Living                      Prior Function            PT Goals (current goals can now be found in the care plan section) Acute Rehab PT Goals Patient  Stated Goal: obtain dentures PT Goal Formulation: With patient Time For Goal Achievement: 07/22/21 Potential to Achieve Goals: Fair Progress towards PT goals: Progressing toward goals    Frequency    Min 2X/week      PT Plan Current plan remains appropriate    Co-evaluation              AM-PAC PT "6 Clicks" Mobility   Outcome Measure  Help needed turning from your back to your side while in a flat bed without using bedrails?: A Little Help needed moving from lying on your back to sitting on the side of a flat bed without using bedrails?: A Lot Help needed moving to and from a bed to a chair (including a wheelchair)?: A Little Help needed standing up from a chair using your arms (e.g., wheelchair or bedside chair)?: A Little Help needed to walk in hospital room?: A Little Help needed climbing 3-5 steps with a railing? : A Lot 6 Click Score: 16    End of Session Equipment Utilized During Treatment: Gait belt Activity Tolerance: Patient tolerated treatment well Patient left: in chair;with call bell/phone within reach;with chair alarm set;with nursing/sitter in room   PT Visit Diagnosis: Unsteadiness on feet (R26.81);Muscle weakness (generalized) (M62.81);History of falling (Z91.81)     Time: 2878-6767 PT Time Calculation (min) (ACUTE ONLY): 14 min  Charges:  $Therapeutic Activity: 8-22 mins                     Aleda Grana, PT, DPT 07/09/21, 1:35 PM    Sandi Mariscal 07/09/2021, 1:33 PM

## 2021-07-10 DIAGNOSIS — T796XXD Traumatic ischemia of muscle, subsequent encounter: Secondary | ICD-10-CM | POA: Diagnosis not present

## 2021-07-10 DIAGNOSIS — R410 Disorientation, unspecified: Secondary | ICD-10-CM | POA: Diagnosis not present

## 2021-07-10 DIAGNOSIS — R296 Repeated falls: Secondary | ICD-10-CM | POA: Diagnosis not present

## 2021-07-10 DIAGNOSIS — E876 Hypokalemia: Secondary | ICD-10-CM | POA: Diagnosis not present

## 2021-07-10 LAB — RESP PANEL BY RT-PCR (FLU A&B, COVID) ARPGX2
Influenza A by PCR: NEGATIVE
Influenza B by PCR: NEGATIVE
SARS Coronavirus 2 by RT PCR: NEGATIVE

## 2021-07-10 MED ORDER — NICOTINE 14 MG/24HR TD PT24: 14.0000 mg | MEDICATED_PATCH | Freq: Every day | TRANSDERMAL | 0 refills | Status: DC

## 2021-07-10 NOTE — TOC Progression Note (Addendum)
Transition of Care [facility]) - Progression Note    Patient Details  Name: William Schwartz MRN: [id number] Date of Birth: 03-04-1946  [facility] CM/SW Contact  Barrie Dunker, RN Phone Number: 07/10/2021, 11:22 AM  Clinical Narrative:     Spoke with William Schwartz the patient's daughter and reviewed the bed offers, she chose [facility], I notified Tina at [facility] stated that the patient has medicare and Medicaid , I notified Inetta Fermo of this, will verify if VA is Primary or not   The patient had Medicare A primary will go to room 711, Daughter William Schwartz is aware, will call EMS once the bedside nurse calls report    Expected Discharge Plan and Services                                                 Social Determinants of Health (SDOH) Interventions    Readmission Risk Interventions No flowsheet data found.

## 2021-07-10 NOTE — Discharge Summary (Signed)
William Schwartz:[id number] DOB: 03-24-1946 DOA: 07/06/2021  PCP: [facility]  Admit date: 07/06/2021 Discharge date: 07/10/2021  Admitted From: Home Disposition: SNF  Recommendations for Outpatient Follow-up:  Follow up with PCP in 1 week Please obtain BMP/CBC in one week.   Discharge Condition:Stable CODE STATUS:Full Diet: heart healthy  Brief/Interim Summary: Per William Schwartz is a 75 y.o. male with medical history significant for hypertension who has had multiple ER visits for evaluation of falls.  Patient resides in a senior living apartment alone and per daughter has had poor oral intake.  She is also concerned that he has become increasingly confused over the last couple of months and his speech is slurred. Patient has been holding in the ER awaiting evaluation for placement and physical therapy but labs revealed hypokalemia as well as elevated total CK levels and so admission has been requested.  He was confused on admission.Labs show sodium 137, potassium 2.8, chloride 96, bicarb 32, glucose 113, BUN 25, creatinine 1.17, calcium 9.0, magnesium 2.0, alkaline phosphatase 36, albumin 3.6, AST 89, ALT 34, total protein 6.5, direct bilirubin 0.2, indirect bilirubin 0.9, total CK5 175, troponin 14, white count 13.8, hemoglobin 13.7, hematocrit 38.3, MCV 91.8, RDW 14.2, Respiratory viral panel is negative CT scan of the head without contrast/cervical spine CT shows no acute intracranial abnormality.  No acute displaced fracture or traumatic listhesis of the cervical spine. Chest x-ray reviewed by me shows no evidence of acute cardiopulmonary disease. MRI of the brain:No evidence of recent infarction, hemorrhage, or mass. Chronic microvascular ischemic changes.  Was admitted for acute mental status change and rhabdomyolysis.  Rhabdomyolysis Traumatic following multiple falls CK trended down Was treated with IV fluids Held HCTZ and statins Can resume statin on  8/27 Discontinued HCTZ       Altered mental status Patient is oriented only to person and place which according to the daughter is not his baseline and has been progressively worsening over the last couple of months Needs to f/u with neurology or pcp  for diagnosis of dementia as outpt MRI without acute findings.  per daughter MS at baseline now         Frequent falls Fall precautions Pt tired to get out of his bed and fell during his hospitalization and repeat CT of the head was negative for acute abnormality            Hypokalemia Was replaced and stable.  Likely from HCTZ       Hypothyroidism Continue Synthroid       Hypertension Continue metoprolol Dc lisinopril an hctz.  Was started on midodrine as bp on low side, but d/c'd     Nicotine dependence Smoking cessation has been discussed with patient in detail on nicotine transdermal patch 14 mg daily      Discharge Diagnoses:  Principal Problem:   Rhabdomyolysis Active Problems:   AMS (altered mental status)   Hypokalemia   Frequent falls   Hypothyroidism    Discharge Instructions  Discharge Instructions     Call MD for:  temperature >100.4   Complete by: As directed    Diet - low sodium heart healthy   Complete by: As directed    Increase activity slowly   Complete by: As directed       Allergies as of 07/10/2021       Reactions   Fentanyl    Other reaction(s): Low blood pressure        Medication List  STOP taking these medications    lisinopril-hydrochlorothiazide 20-12.5 MG tablet Commonly known as: ZESTORETIC   simvastatin 40 MG tablet Commonly known as: ZOCOR       TAKE these medications    aspirin EC 81 MG tablet Take 81 mg by mouth every morning.   atorvastatin 80 MG tablet Commonly known as: LIPITOR Take 80 mg by mouth daily.   levothyroxine 125 MCG tablet Commonly known as: SYNTHROID Take 125 mcg by mouth daily.   metoprolol tartrate 25 MG  tablet Commonly known as: LOPRESSOR Take 0.5 tablets (12.5 mg total) by mouth 2 (two) times daily.   nicotine 14 mg/24hr patch Commonly known as: NICODERM CQ - dosed in mg/24 hours Place 1 patch (14 mg total) onto the skin daily. Start taking on: July 11, 2021        Follow-up Information     [facility] Follow [facility] in 1 week(s).   Specialty: General Practice Contact information: [address] [place] [id number] [phone]                Allergies  Allergen Reactions   Fentanyl     Other reaction(s): Low blood pressure    Consultations:    Procedures/Studies: DG Chest 2 View  Result Date: 06/18/2021 CLINICAL DATA:  Intermittent chest pain and abdominal pain EXAM: CHEST - 2 VIEW COMPARISON:  01/02/2014 FINDINGS: Suspected emphysema. Atherosclerotic calcification of the aortic arch. Heart size within normal limits. The lungs appear otherwise clear. Mild dextroconvex thoracic scoliosis. IMPRESSION: 1. Aortic Atherosclerosis (ICD10-I70.0) and Emphysema (ICD10-J43.9). 2. No acute radiographic findings. Electronically Signed   By: Gaylyn RongWalter  Liebkemann M.D.   On: 06/18/2021 13:48   CT HEAD WO CONTRAST (5MM)  Result Date: 07/08/2021 CLINICAL DATA:  Fall, head trauma EXAM: CT HEAD WITHOUT CONTRAST TECHNIQUE: Contiguous axial images were obtained from the base of the skull through the vertex without intravenous contrast. COMPARISON:  07/06/2021 FINDINGS: Brain: There is atrophy and chronic small vessel disease changes. No acute intracranial abnormality. Specifically, no hemorrhage, hydrocephalus, mass lesion, acute infarction, or significant intracranial injury. Vascular: No hyperdense vessel or unexpected calcification. Skull: No acute calvarial abnormality. Sinuses/Orbits: No acute findings Other: None IMPRESSION: Atrophy, chronic microvascular disease. No acute intracranial abnormality. Electronically Signed   By: Charlett NoseKevin  Dover M.D.   On: 07/08/2021 17:04   CT HEAD  WO CONTRAST (5MM)  Result Date: 07/06/2021 CLINICAL DATA:  Status post fall.  Increased confusion. EXAM: CT HEAD WITHOUT CONTRAST CT CERVICAL SPINE WITHOUT CONTRAST TECHNIQUE: Multidetector CT imaging of the head and cervical spine was performed following the standard protocol without intravenous contrast. Multiplanar CT image reconstructions of the cervical spine were also generated. COMPARISON:  CT head 07/05/2021. FINDINGS: CT HEAD FINDINGS Brain: Cerebral ventricle sizes are concordant with the degree of cerebral volume loss. No evidence of large-territorial acute infarction. No parenchymal hemorrhage. No mass lesion. No extra-axial collection. No mass effect or midline shift. No hydrocephalus. Basilar cisterns are patent. Vascular: No hyperdense vessel. Atherosclerotic calcifications are present within the cavernous internal carotid arteries. Skull: No acute fracture or focal lesion. Sinuses/Orbits: Paranasal sinuses and mastoid air cells are clear. The orbits are unremarkable. Other: In stray shin of a mild right parieto-occipital scalp edema and 6 mm hematoma. No new hematoma formation. CT CERVICAL SPINE FINDINGS Alignment: Normal. Skull base and vertebrae: Multilevel moderate degenerative changes of the spine. No acute fracture. No aggressive appearing focal osseous lesion or focal pathologic process. Soft tissues and spinal canal: No prevertebral fluid or  swelling. No visible canal hematoma. Upper chest: Unremarkable. Other: None. IMPRESSION: 1. No acute intracranial abnormality. 2. No acute displaced fracture or traumatic listhesis of the cervical spine. Electronically Signed   By: Tish Frederickson M.D.   On: 07/06/2021 23:29   CT HEAD WO CONTRAST ( )  Result Date: 07/05/2021 CLINICAL DATA:  Larey Seat out of bed last night. EXAM: CT HEAD WITHOUT CONTRAST TECHNIQUE: Contiguous axial images were obtained from the base of the skull through the vertex without intravenous contrast. COMPARISON:  CT head from  yesterday. FINDINGS: Brain: No evidence of acute infarction, hemorrhage, hydrocephalus, extra-axial collection or mass lesion/mass effect. Stable atrophy. Vascular: Atherosclerotic vascular calcification of the carotid siphons. No hyperdense vessel. Skull: Normal. Negative for fracture or focal lesion. Sinuses/Orbits: No acute finding. Other: Small right posterior scalp hematoma again noted. IMPRESSION: 1. No acute intracranial abnormality. 2. Unchanged small right posterior scalp hematoma. Electronically Signed   By: Obie Dredge M.D.   On: 07/05/2021 11:19   CT HEAD WO CONTRAST ( )  Result Date: 07/04/2021 CLINICAL DATA:  Head injury after fall. EXAM: CT HEAD WITHOUT CONTRAST CT CERVICAL SPINE WITHOUT CONTRAST TECHNIQUE: Multidetector CT imaging of the head and cervical spine was performed following the standard protocol without intravenous contrast. Multiplanar CT image reconstructions of the cervical spine were also generated. COMPARISON:  September 27, 2019. FINDINGS: CT HEAD FINDINGS Brain: Mild diffuse cortical atrophy is noted. No mass effect or midline shift is noted. Ventricular size is within normal limits. There is no evidence of mass lesion, hemorrhage or acute infarction. Vascular: No hyperdense vessel or unexpected calcification. Skull: Normal. Negative for fracture or focal lesion. Sinuses/Orbits: No acute finding. Other: Small right posterior scalp hematoma is noted. CT CERVICAL SPINE FINDINGS Alignment: Normal. Skull base and vertebrae: No acute fracture. No primary bone lesion or focal pathologic process. Soft tissues and spinal canal: No prevertebral fluid or swelling. No visible canal hematoma. Disc levels: Moderate degenerative disc disease is noted at C5-6, C6-7 and C7-T1. Upper chest: Negative. Other: Mild degenerative changes are seen involving posterior facet joints bilaterally. IMPRESSION: Small right posterior scalp hematoma is noted. No acute intracranial abnormality seen.  Moderate multilevel degenerative disc disease. No acute abnormality seen in the cervical spine. Electronically Signed   By: Lupita Raider M.D.   On: 07/04/2021 19:54   CT Cervical Spine Wo Contrast  Result Date: 07/06/2021 CLINICAL DATA:  Status post fall.  Increased confusion. EXAM: CT HEAD WITHOUT CONTRAST CT CERVICAL SPINE WITHOUT CONTRAST TECHNIQUE: Multidetector CT imaging of the head and cervical spine was performed following the standard protocol without intravenous contrast. Multiplanar CT image reconstructions of the cervical spine were also generated. COMPARISON:  CT head 07/05/2021. FINDINGS: CT HEAD FINDINGS Brain: Cerebral ventricle sizes are concordant with the degree of cerebral volume loss. No evidence of large-territorial acute infarction. No parenchymal hemorrhage. No mass lesion. No extra-axial collection. No mass effect or midline shift. No hydrocephalus. Basilar cisterns are patent. Vascular: No hyperdense vessel. Atherosclerotic calcifications are present within the cavernous internal carotid arteries. Skull: No acute fracture or focal lesion. Sinuses/Orbits: Paranasal sinuses and mastoid air cells are clear. The orbits are unremarkable. Other: In stray shin of a mild right parieto-occipital scalp edema and 6 mm hematoma. No new hematoma formation. CT CERVICAL SPINE FINDINGS Alignment: Normal. Skull base and vertebrae: Multilevel moderate degenerative changes of the spine. No acute fracture. No aggressive appearing focal osseous lesion or focal pathologic process. Soft tissues and spinal canal: No prevertebral fluid or swelling. No  visible canal hematoma. Upper chest: Unremarkable. Other: None. IMPRESSION: 1. No acute intracranial abnormality. 2. No acute displaced fracture or traumatic listhesis of the cervical spine. Electronically Signed   By: Tish Frederickson M.D.   On: 07/06/2021 23:29   CT Cervical Spine Wo Contrast  Result Date: 07/04/2021 CLINICAL DATA:  Head injury after fall.  EXAM: CT HEAD WITHOUT CONTRAST CT CERVICAL SPINE WITHOUT CONTRAST TECHNIQUE: Multidetector CT imaging of the head and cervical spine was performed following the standard protocol without intravenous contrast. Multiplanar CT image reconstructions of the cervical spine were also generated. COMPARISON:  September 27, 2019. FINDINGS: CT HEAD FINDINGS Brain: Mild diffuse cortical atrophy is noted. No mass effect or midline shift is noted. Ventricular size is within normal limits. There is no evidence of mass lesion, hemorrhage or acute infarction. Vascular: No hyperdense vessel or unexpected calcification. Skull: Normal. Negative for fracture or focal lesion. Sinuses/Orbits: No acute finding. Other: Small right posterior scalp hematoma is noted. CT CERVICAL SPINE FINDINGS Alignment: Normal. Skull base and vertebrae: No acute fracture. No primary bone lesion or focal pathologic process. Soft tissues and spinal canal: No prevertebral fluid or swelling. No visible canal hematoma. Disc levels: Moderate degenerative disc disease is noted at C5-6, C6-7 and C7-T1. Upper chest: Negative. Other: Mild degenerative changes are seen involving posterior facet joints bilaterally. IMPRESSION: Small right posterior scalp hematoma is noted. No acute intracranial abnormality seen. Moderate multilevel degenerative disc disease. No acute abnormality seen in the cervical spine. Electronically Signed   By: Lupita Raider M.D.   On: 07/04/2021 19:54   MR BRAIN WO CONTRAST  Result Date: 07/07/2021 CLINICAL DATA:  Neuro deficit, acute, stroke suspected EXAM: MRI HEAD WITHOUT CONTRAST TECHNIQUE: Multiplanar, multiecho pulse sequences of the brain and surrounding structures were obtained without intravenous contrast. COMPARISON:  None. FINDINGS: Brain: There is no acute infarction or intracranial hemorrhage. There is no intracranial mass, mass effect, or edema. There is no hydrocephalus or extra-axial fluid collection. Ventricles and sulci are  prominent reflecting generalized parenchymal volume loss. Patchy foci of T2 hyperintensity in the supratentorial white matter are nonspecific but may reflect mild chronic microvascular ischemic changes. Vascular: Major vessel flow voids at the skull base are preserved. Skull and upper cervical spine: Normal marrow signal is preserved. Sinuses/Orbits: Minor mucosal thickening. Bilateral lens replacements. Other: Sella is unremarkable.  Bilateral mastoid effusions. IMPRESSION: No evidence of recent infarction, hemorrhage, or mass. Chronic microvascular ischemic changes. Electronically Signed   By: Guadlupe Spanish M.D.   On: 07/07/2021 16:49   DG Chest Portable 1 View  Result Date: 07/07/2021 CLINICAL DATA:  Leukocytosis. EXAM: PORTABLE CHEST 1 VIEW COMPARISON:  Two days ago FINDINGS: Normal heart size and mediastinal contours. There is no edema, consolidation, effusion, or pneumothorax. No acute osseous finding. IMPRESSION: No evidence of acute disease. Electronically Signed   By: Marnee Spring M.D.   On: 07/07/2021 07:01   DG Chest Portable 1 View  Result Date: 07/05/2021 CLINICAL DATA:  Weakness.  Unwitnessed fall EXAM: PORTABLE CHEST 1 VIEW COMPARISON:  06/18/2021 FINDINGS: The heart size and mediastinal contours are within normal limits. Atherosclerotic calcification of the aortic knob. Skin fold overlies the left hemithorax. No focal airspace consolidation, pleural effusion, or pneumothorax. The visualized skeletal structures are unremarkable. IMPRESSION: No active disease. Electronically Signed   By: Duanne Guess D.O.   On: 07/05/2021 13:02      Subjective: No complaints of sob, cp  Discharge Exam: Vitals:   07/10/21 0901 07/10/21 1131  BP:  (!) 174/93  Pulse: 72 64  Resp:  17  Temp:  97.7 F (36.5 C)  SpO2:  99%   Vitals:   07/10/21 0430 07/10/21 0730 07/10/21 0901 07/10/21 1131  BP: 123/73 (!) 155/84  (!) 174/93  Pulse: (!) 55 (!) 59 72 64  Resp: 16 17  17   Temp: 97.8 F  (36.6 C) 97.6 F (36.4 C)  97.7 F (36.5 C)  TempSrc:      SpO2: 96% 100%  99%  Weight:      Height:        General: Pt is alert, awake, not in acute distress Cardiovascular: RRR, S1/S2 +, no rubs, no gallops Respiratory: CTA bilaterally, no wheezing, no rhonchi Abdominal: Soft, NT, ND, bowel sounds + Extremities: no edema    The results of significant diagnostics from this hospitalization (including imaging, microbiology, ancillary and laboratory) are listed below for reference.     Microbiology: Recent Results (from the past 240 hour(s))  Culture, blood (Routine X 2) w Reflex to ID Panel     Status: None (Preliminary result)   Collection Time: 07/07/21  6:12 AM   Specimen: BLOOD  Result Value Ref Range Status   Specimen Description BLOOD RIGHT FOREARM  Final   Special Requests   Final    BOTTLES DRAWN AEROBIC AND ANAEROBIC Blood Culture adequate volume   Culture   Final    NO GROWTH 3 DAYS Performed at [facility], [address], [place], [place]    Report Status PENDING  Incomplete  Culture, blood (Routine X 2) w Reflex to ID Panel     Status: None (Preliminary result)   Collection Time: 07/07/21  6:12 AM   Specimen: BLOOD  Result Value Ref Range Status   Specimen Description BLOOD LEFT FOREARM  Final   Special Requests   Final    BOTTLES DRAWN AEROBIC AND ANAEROBIC Blood Culture results may not be optimal due to an inadequate volume of blood received in culture bottles   Culture   Final    NO GROWTH 3 DAYS Performed at [facility], [address], [place], [place]    Report Status PENDING  Incomplete  Resp Panel by RT-PCR (Flu A&B, Covid) Urine, Clean Catch     Status: None   Collection Time: 07/07/21  6:17 AM   Specimen: Urine, Clean Catch; Nasopharyngeal(NP) swabs in vial transport medium  Result Value Ref Range Status   SARS Coronavirus 2 by RT PCR NEGATIVE NEGATIVE Final    Comment: (NOTE) SARS-CoV-2 target  nucleic acids are NOT DETECTED.  The SARS-CoV-2 RNA is generally detectable in upper respiratory specimens during the acute phase of infection. The lowest concentration of SARS-CoV-2 viral copies this assay can detect is 138 copies/mL. A negative result does not preclude SARS-Cov-2 infection and should not be used as the sole basis for treatment or other patient management decisions. A negative result may occur with  improper specimen collection/handling, submission of specimen other than nasopharyngeal swab, presence of viral mutation(s) within the areas targeted by this assay, and inadequate number of viral copies(<138 copies/mL). A negative result must be combined with clinical observations, patient history, and epidemiological information. The expected result is Negative.  Fact Sheet for Patients:  07/09/21  Fact Sheet for Healthcare Providers:  BloggerCourse.com  This test is no t yet approved or cleared by the [employer] FDA and  has been authorized for detection and/or diagnosis of SARS-CoV-2 by FDA under an  Emergency Use Authorization (EUA). This EUA will remain  in effect (meaning this test can be used) for the duration of the COVID-19 declaration under Section 564(b)(1) of the Act, 21 U.S.C.section 360bbb-3(b)(1), unless the authorization is terminated  or revoked sooner.       Influenza A by PCR NEGATIVE NEGATIVE Final   Influenza B by PCR NEGATIVE NEGATIVE Final    Comment: (NOTE) The Xpert Xpress SARS-CoV-2/FLU/RSV plus assay is intended as an aid in the diagnosis of influenza from Nasopharyngeal swab specimens and should not be used as a sole basis for treatment. Nasal washings and aspirates are unacceptable for Xpert Xpress SARS-CoV-2/FLU/RSV testing.  Fact Sheet for Patients: BloggerCourse.com  Fact Sheet for Healthcare  Providers: [employer]ious[employer]ker[employer]it  This test is not yet approved or cleared by the [place] FDA and has been authorized for detection and/or diagnosis of SARS-CoV-2 by FDA under an Emergency Use Authorization (EUA). This EUA will remain in effect (meaning this test can be used) for the duration of the COVID-19 declaration under Section 564(b)(1) of the Act, 21 U.S.C. section 360bbb-3(b)(1), unless the authorization is terminated or revoked.  Performed at [facility], [address] [place], [place] [address]      Labs: BNP (last 3 results) No results for input(s): BNP in the last 8760 hours. Basic Metabolic Panel: Recent Labs  Lab 07/05/21 1023 07/06/21 1951 07/06/21 2226 07/07/21 1400 07/07/21 1943 07/08/21 0518  NA 135 137  --  138 138 140  K 3.0* 2.8*  --  3.2* 3.4* 3.5  CL 90* 96*  --  103 102 104  CO2 30 32  --  GLUCOSE 96 113*  --  89 98 83  BUN 14 25*  --  CREATININE 1.01 1.17  --  0.88 0.89 0.80  CALCIUM 8.9 9.0  --  8.2* 8.7* 8.2*  MG  --   --  2.0  --   --   --    Liver Function Tests: Recent Labs  Lab 07/07/21 0612  AST 89*  ALT 34  ALKPHOS 36*  BILITOT 1.1  PROT 6.5  ALBUMIN 3.6   No results for input(s): LIPASE, AMYLASE in the last 168 hours. Recent Labs  Lab 07/07/21 1943  AMMONIA <9*   CBC: Recent Labs  Lab 07/05/21 1023 07/06/21 1951 07/07/21 1943 07/08/21 0518  WBC 13.7* 13.8* 9.9 8.6  NEUTROABS  --  12.3* 7.9*  --   HGB 15.3 13.7 14.1 12.3*  HCT 42.4 38.3* 40.8 36.1*  MCV 92.0 91.8 92.9 94.3  PLT 313 266 276 240   Cardiac Enzymes: Recent Labs  Lab 07/05/21 1023 07/06/21 2226 07/09/21 1158  CKTOTAL 3,693* 5,175* 801*   BNP: Invalid input(s): POCBNP CBG: Recent Labs  Lab 07/07/21 0640  GLUCAP 105*   D-Dimer No results for input(s): DDIMER in the last 72 hours. Hgb A1c No results for input(s): HGBA1C in the last 72 hours. Lipid Profile No results for  input(s): CHOL, HDL, LDLCALC, TRIG, CHOLHDL, LDLDIRECT in the last 72 hours. Thyroid function studies Recent Labs    07/07/21 1943  TSH 102.231*   Anemia work up Recent Labs    07/07/21 1943  VITAMINB12 195   Urinalysis    Component Value Date/Time   COLORURINE YELLOW (A) 07/07/2021 0612   APPEARANCEUR HAZY (A) 07/07/2021 0612   LABSPEC 1.023 07/07/2021 0612   PHURINE 5.0 07/07/2021 0612   GLUCOSEU NEGATIVE 07/07/2021 0612   HGBUR MODERATE (  A) 07/07/2021 0612   BILIRUBINUR NEGATIVE 07/07/2021 0612   KETONESUR NEGATIVE 07/07/2021 0612   PROTEINUR 100 (A) 07/07/2021 0612   NITRITE NEGATIVE 07/07/2021 0612   LEUKOCYTESUR NEGATIVE 07/07/2021 0612   Sepsis Labs Invalid input(s): PROCALCITONIN,  WBC,  LACTICIDVEN Microbiology Recent Results (from the past 240 hour(s))  Culture, blood (Routine X 2) w Reflex to ID Panel     Status: None (Preliminary result)   Collection Time: 07/07/21  6:12 AM   Specimen: BLOOD  Result Value Ref Range Status   Specimen Description BLOOD RIGHT FOREARM  Final   Special Requests   Final    BOTTLES DRAWN AEROBIC AND ANAEROBIC Blood Culture adequate volume   Culture   Final    NO GROWTH 3 DAYS Performed at [facility], [address] [place], [place] [address]    Report Status PENDING  Incomplete  Culture, blood (Routine X 2) w Reflex to ID Panel     Status: None (Preliminary result)   Collection Time: 07/07/21  6:12 AM   Specimen: BLOOD  Result Value Ref Range Status   Specimen Description BLOOD LEFT FOREARM  Final   Special Requests   Final    BOTTLES DRAWN AEROBIC AND ANAEROBIC Blood Culture results may not be optimal due to an inadequate volume of blood received in culture bottles   Culture   Final    NO GROWTH 3 DAYS Performed at [facility], [address] [place], [place] [address]    Report Status PENDING  Incomplete  Resp Panel by RT-PCR (Flu A&B, Covid) Urine, Clean Catch     Status: None   Collection  Time: 07/07/21  6:17 AM   Specimen: Urine, Clean Catch; Nasopharyngeal(NP) swabs in vial transport medium  Result Value Ref Range Status   SARS Coronavirus 2 by RT PCR NEGATIVE NEGATIVE Final    Comment: (NOTE) SARS-CoV-2 target nucleic acids are NOT DETECTED.  The SARS-CoV-2 RNA is generally detectable in upper respiratory specimens during the acute phase of infection. The lowest concentration of SARS-CoV-2 viral copies this assay can detect is 138 copies/mL. A negative result does not preclude SARS-Cov-2 infection and should not be used as the sole basis for treatment or other patient management decisions. A negative result may occur with  improper specimen collection/handling, submission of specimen other than nasopharyngeal swab, presence of viral mutation(s) within the areas targeted by this assay, and inadequate number of viral copies(<138 copies/mL). A negative result must be combined with clinical observations, patient history, and epidemiological information. The expected result is Negative.  Fact Sheet for Patients:  BloggerCourse.com  Fact Sheet for Healthcare Providers:  SeriousBroker.it  This test is no t yet approved or cleared by the [place] FDA and  has been authorized for detection and/or diagnosis of SARS-CoV-2 by FDA under an Emergency Use Authorization (EUA). This EUA will remain  in effect (meaning this test can be used) for the duration of the COVID-19 declaration under Section 564(b)(1) of the Act, 21 U.S.C.section 360bbb-3(b)(1), unless the authorization is terminated  or revoked sooner.       Influenza A by PCR NEGATIVE NEGATIVE Final   Influenza B by PCR NEGATIVE NEGATIVE Final    Comment: (NOTE) The Xpert Xpress SARS-CoV-2/FLU/RSV plus assay is intended as an aid in the diagnosis of influenza from Nasopharyngeal swab specimens and should not be used as a sole basis for treatment. Nasal washings  and aspirates are unacceptable for Xpert Xpress SARS-CoV-2/FLU/RSV testing.  Fact Sheet for  Patients: BloggerCourse.com  Fact Sheet for Healthcare Providers: SeriousBroker.it  This test is not yet approved or cleared by the [place] FDA and has been authorized for detection and/or diagnosis of SARS-CoV-2 by FDA under an Emergency Use Authorization (EUA). This EUA will remain in effect (meaning this test can be used) for the duration of the COVID-19 declaration under Section 564(b)(1) of the Act, 21 U.S.C. section 360bbb-3(b)(1), unless the authorization is terminated or revoked.  Performed at [facility], [address] [place], [place] [address]      Time coordinating discharge: Over 30 minutes  SIGNED:   Lynn Ito, MD  [facility] 07/10/2021, 12:12 PM Pager   If 7PM-7AM, please contact night-coverage [email or web address] Password [email or web address]

## 2021-07-12 LAB — CULTURE, BLOOD (ROUTINE X 2)
Culture: NO GROWTH
Culture: NO GROWTH
Special Requests: ADEQUATE

## 2021-07-22 ENCOUNTER — Emergency Department
Admission: EM | Admit: 2021-07-22 | Discharge: 2021-07-23 | Disposition: A | Discharge status: Home / Self Care | Payer: No Typology Code available for payment source | Attending: Emergency Medicine | Admitting: Emergency Medicine

## 2021-07-22 ENCOUNTER — Encounter: Payer: Self-pay | Admitting: Emergency Medicine

## 2021-07-22 DIAGNOSIS — R55 Syncope and collapse: Secondary | ICD-10-CM | POA: Diagnosis present

## 2021-07-22 DIAGNOSIS — E039 Hypothyroidism, unspecified: Secondary | ICD-10-CM | POA: Diagnosis not present

## 2021-07-22 DIAGNOSIS — Z87891 Personal history of nicotine dependence: Secondary | ICD-10-CM | POA: Insufficient documentation

## 2021-07-22 DIAGNOSIS — I10 Essential (primary) hypertension: Secondary | ICD-10-CM | POA: Diagnosis not present

## 2021-07-22 DIAGNOSIS — Z79899 Other long term (current) drug therapy: Secondary | ICD-10-CM | POA: Diagnosis not present

## 2021-07-22 DIAGNOSIS — Z7982 Long term (current) use of aspirin: Secondary | ICD-10-CM | POA: Diagnosis not present

## 2021-07-22 LAB — CBC WITH DIFFERENTIAL/PLATELET
Abs Immature Granulocytes: 0.04 10*3/uL (ref 0.00–0.07)
Basophils Absolute: 0.1 10*3/uL (ref 0.0–0.1)
Basophils Relative: 1 %
Eosinophils Absolute: 0.1 10*3/uL (ref 0.0–0.5)
Eosinophils Relative: 1 %
HCT: 30.8 % — ABNORMAL LOW (ref 39.0–52.0)
Hemoglobin: 10.4 g/dL — ABNORMAL LOW (ref 13.0–17.0)
Immature Granulocytes: 1 %
Lymphocytes Relative: 16 %
Lymphs Abs: 1.4 10*3/uL (ref 0.7–4.0)
MCH: 33.2 pg (ref 26.0–34.0)
MCHC: 33.8 g/dL (ref 30.0–36.0)
MCV: 98.4 fL (ref 80.0–100.0)
Monocytes Absolute: 0.6 10*3/uL (ref 0.1–1.0)
Monocytes Relative: 7 %
Neutro Abs: 6.3 10*3/uL (ref 1.7–7.7)
Neutrophils Relative %: 74 %
Platelets: 309 10*3/uL (ref 150–400)
RBC: 3.13 MIL/uL — ABNORMAL LOW (ref 4.22–5.81)
RDW: 14.9 % (ref 11.5–15.5)
WBC: 8.6 10*3/uL (ref 4.0–10.5)
nRBC: 0 % (ref 0.0–0.2)

## 2021-07-22 LAB — URINALYSIS, COMPLETE (UACMP) WITH MICROSCOPIC
Bilirubin Urine: NEGATIVE
Glucose, UA: NEGATIVE mg/dL
Ketones, ur: NEGATIVE mg/dL
Leukocytes,Ua: NEGATIVE
Nitrite: NEGATIVE
Protein, ur: 30 mg/dL — AB
Specific Gravity, Urine: 1.025 (ref 1.005–1.030)
Squamous Epithelial / HPF: NONE SEEN (ref 0–5)
pH: 6 (ref 5.0–8.0)

## 2021-07-22 LAB — TROPONIN I (HIGH SENSITIVITY)
Troponin I (High Sensitivity): 5 ng/L (ref <18)
Troponin I (High Sensitivity): 7 ng/L (ref <18)

## 2021-07-22 LAB — BASIC METABOLIC PANEL
Anion gap: 6 (ref 5–15)
BUN: 14 mg/dL (ref 8–23)
CO2: 28 mmol/L (ref 22–32)
Calcium: 8.2 mg/dL — ABNORMAL LOW (ref 8.9–10.3)
Chloride: 101 mmol/L (ref 98–111)
Creatinine, Ser: 0.81 mg/dL (ref 0.61–1.24)
GFR, Estimated: >60 mL/min (ref >60)
Glucose, Bld: 141 mg/dL — ABNORMAL HIGH (ref 70–99)
Potassium: 3.8 mmol/L (ref 3.5–5.1)
Sodium: 135 mmol/L (ref 135–145)

## 2021-07-22 NOTE — ED Notes (Signed)
Pt has taken off his cardiac monitoring and is impatient wanting to leave.

## 2021-07-22 NOTE — ED Notes (Signed)
Pts daughter called. Daughter states they wheeled the pt out to the parking lot to smoke a cigarette when he passed out in the wheelchair. Staff and her husband called 911.

## 2021-07-22 NOTE — Discharge Instructions (Addendum)
Please seek medical attention for any high fevers, chest pain, shortness of breath, change in behavior, persistent vomiting, bloody stool or any other new or concerning symptoms.  

## 2021-07-22 NOTE — ED Provider Notes (Signed)
[facility] Emergency Department Provider Note  ____________________________________________   I have reviewed the triage vital signs and the nursing notes.   HISTORY  Chief Complaint Loss of Consciousness   History limited by: Confusion   HPI William Schwartz is a 75 y.o. male who presents to the emergency department today because of concern for a syncopal episode. The patient was at his living facility when it happened. Apparently he was in his wheelchair when it occurred. The patient states that he did have some central discomfort when this occurred. Says he has passed out in the past. Recent hospitalization for multiple falls. It does appear that patient has been having issues with memory and there is concern for possible dementia.   Records reviewed. Per medical record review patient has a history of HTN, recent hospitalization for frequent falls.   Past Medical History:  Diagnosis Date   Hypertension     Patient Active Problem List   Diagnosis Date Noted   Rhabdomyolysis 07/07/2021   AMS (altered mental status) 07/07/2021   Hypokalemia 07/07/2021   Frequent falls 07/07/2021   Hypothyroidism 07/07/2021    No past surgical history on file.  Prior to Admission medications   Medication Sig Start Date End Date Taking? Authorizing Provider  aspirin EC 81 MG tablet Take 81 mg by mouth every morning.    [provider]  atorvastatin (LIPITOR) 80 MG tablet Take 80 mg by mouth daily.    [provider]  levothyroxine (SYNTHROID, LEVOTHROID) 125 MCG tablet Take 125 mcg by mouth daily. Patient not taking: Reported on 07/10/2021 05/26/15   [provider]  metoprolol tartrate (LOPRESSOR) 25 MG tablet Take 0.5 tablets (12.5 mg total) by mouth 2 (two) times daily. 06/18/21   Triplett, Cari B, FNP  nicotine (NICODERM CQ - DOSED IN MG/24 HOURS) 14 mg/24hr patch Place 1 patch (14 mg total) onto the skin daily. 07/11/21   Lynn Ito, MD     Allergies Fentanyl  No family history on file.  Social History Social History   Tobacco Use   Smoking status: Former    Packs/day: 0.50    Types: Cigarettes  Substance Use Topics   Alcohol use: Yes    Review of Systems Constitutional: No fever/chills Eyes: No visual changes. ENT: No sore throat. Cardiovascular: Positive for chest pain. Respiratory: Denies shortness of breath. Gastrointestinal: No abdominal pain.  No nausea, no vomiting.  No diarrhea.   Genitourinary: Negative for dysuria. Musculoskeletal: Negative for back pain. Skin: Negative for rash. Neurological: Positive for syncopal episode.   ____________________________________________   PHYSICAL EXAM:  VITAL SIGNS: ED Triage Vitals  Enc Vitals Group     BP 07/22/21 1649 95/64     Pulse Rate 07/22/21 1649 (!) 50     Resp 07/22/21 1649 11     Temp 07/22/21 1649 97.8 F (36.6 C)     Temp Source 07/22/21 1649 Oral     SpO2 07/22/21 1649 100 %     Weight 07/22/21 1646 151 lb (68.5 kg)     Height 07/22/21 1646 5\' 11"  (1.803 m)     Head Circumference --      [place] Flow --      Pain Score 07/22/21 1646 0   Constitutional: Awake and alert. Not completely oriented.  Eyes: Conjunctivae are normal.  ENT      Head: Normocephalic and atraumatic.      Nose: No congestion/rhinnorhea.      Mouth/Throat: Mucous membranes are moist.  Neck: No stridor. Hematological/Lymphatic/Immunilogical: No cervical lymphadenopathy. Cardiovascular: Bradycardia,  regular rhythm.  No murmurs, rubs, or gallops.  Respiratory: Normal respiratory effort without tachypnea nor retractions. Breath sounds are clear and equal bilaterally. No wheezes/rales/rhonchi. Gastrointestinal: Soft and non tender. No rebound. No guarding.  Genitourinary: Deferred Musculoskeletal: Normal range of motion in all extremities. No lower extremity edema. Neurologic:  Not completely oriented. Moving all extremities.  Skin:  Skin is warm, dry and  intact. No rash noted. Psychiatric: Mood and affect are normal. Speech and behavior are normal. Patient exhibits appropriate insight and judgment.  ____________________________________________    LABS (pertinent positives/negatives)  CBC wbc 8.6, hgb 10.4, plt 309 UA trace hgb, protein 30, 0-5 rbc and wbc, rare bacteria BMP wnl except glu 141, ca 8.2 Trop hs 7 ____________________________________________   EKG  I, Phineas Semen, attending physician, personally viewed and interpreted this EKG  EKG Time: 1648 Rate: 50 Rhythm: sinus bradycardia Axis: normal Intervals: qtc 406 QRS: narrow ST changes: no st elevation Impression: abnormal ekg   ____________________________________________    RADIOLOGY  None  ____________________________________________   PROCEDURES  Procedures  ____________________________________________   INITIAL IMPRESSION / ASSESSMENT AND PLAN / ED COURSE  Pertinent labs & imaging results that were available during my care of the patient were reviewed by me and considered in my medical decision making (see chart for details).   Patient presented to the emergency department today after reported syncopal episode.  [name] work-up here out any obvious etiology.  Troponin negative.  Blood work without any concerning electrolyte abnormalities or anemia.  Patient himself denied any chest pain here in the emergency department.  I doubt PE as a cause of syncope given no reported shortness of breath or tachycardia.  Patient was watched here in the emergency department without any concerning arrhythmias or further episodes of syncope.  This time I think is reasonable for patient be discharged home.  ____________________________________________   FINAL CLINICAL IMPRESSION(S) / ED DIAGNOSES  Final diagnoses:  Syncope, unspecified syncope type     Note: This dictation was prepared with Dragon dictation. Any transcriptional errors that result from  this process are unintentional     Phineas Semen, MD 07/22/21 2354

## 2021-07-22 NOTE — ED Triage Notes (Signed)
Pt arrives via EMS from [facility] where pt was in wheelchair with family and slumped over. Staff had to sternal rub pt to arouse. BP 72/48, given 500 cc NS and up to 101/65. Hx of dementia.

## 2021-07-27 ENCOUNTER — Emergency Department
Admission: EM | Admit: 2021-07-27 | Discharge: 2021-07-27 | Disposition: A | Discharge status: Home / Self Care | Payer: No Typology Code available for payment source | Attending: Emergency Medicine | Admitting: Emergency Medicine

## 2021-07-27 ENCOUNTER — Emergency Department: Payer: No Typology Code available for payment source

## 2021-07-27 ENCOUNTER — Other Ambulatory Visit: Payer: Self-pay

## 2021-07-27 DIAGNOSIS — E039 Hypothyroidism, unspecified: Secondary | ICD-10-CM | POA: Diagnosis not present

## 2021-07-27 DIAGNOSIS — Z79899 Other long term (current) drug therapy: Secondary | ICD-10-CM | POA: Diagnosis not present

## 2021-07-27 DIAGNOSIS — I10 Essential (primary) hypertension: Secondary | ICD-10-CM | POA: Diagnosis not present

## 2021-07-27 DIAGNOSIS — S0101XA Laceration without foreign body of scalp, initial encounter: Secondary | ICD-10-CM | POA: Diagnosis not present

## 2021-07-27 DIAGNOSIS — W01198A Fall on same level from slipping, tripping and stumbling with subsequent striking against other object, initial encounter: Secondary | ICD-10-CM | POA: Insufficient documentation

## 2021-07-27 DIAGNOSIS — Z87891 Personal history of nicotine dependence: Secondary | ICD-10-CM | POA: Diagnosis not present

## 2021-07-27 DIAGNOSIS — S0990XA Unspecified injury of head, initial encounter: Secondary | ICD-10-CM | POA: Diagnosis present

## 2021-07-27 DIAGNOSIS — Z7982 Long term (current) use of aspirin: Secondary | ICD-10-CM | POA: Diagnosis not present

## 2021-07-27 DIAGNOSIS — W19XXXA Unspecified fall, initial encounter: Secondary | ICD-10-CM

## 2021-07-27 MED ORDER — HYDROCODONE-ACETAMINOPHEN 5-325 MG PO TABS: 1.0000 | ORAL_TABLET | Freq: Once | ORAL | Status: DC

## 2021-07-27 NOTE — ED Triage Notes (Signed)
Pt comes from [facility] with c/o fall. Pt states he tripped over mat and fell backwards hitting his head.  Pt has laceration to back of head. Pt denies any blood thinners. Bleeding controlled at this time. Bandage in place by this RN  No LOC

## 2021-07-27 NOTE — ED Triage Notes (Signed)
Pt in via EMS from [facility] with c/o fall hitting the back of his head. Pt with approx 2 in laceration to back of head, bleeding controlled, no LOC

## 2021-07-27 NOTE — ED Provider Notes (Signed)
[facility] Emergency Department Provider Note  ____________________________________________  Time seen: Approximately 7:47 PM  I have reviewed the triage vital signs and the nursing notes.   HISTORY  Chief Complaint Fall    HPI William Schwartz is a 75 y.o. male who presents the emergency department after a mechanical fall.  Patient states that he was walking when he tripped over a mat, he fell backwards hitting his head.  Patient did report loss of consciousness today lasting less than 60 seconds.  Patient has a laceration to the occipital skull.  No subsequent loss of consciousness the patient states the back of his head does hurt.  No vision changes.  Patient denies other complaints currently.  He has no unilateral weakness.  No difficulty formulating thoughts or words.  No chest pain, shortness of breath.  No emesis.  Up-to-date on tetanus immunization.   Patient stays at [facility], has been seen in this department for multiple falls over the last several months.  Patient did have 1 episode where he had laid on the floor for an unknown amount of time and did suffer from rhabdo.  Today's encounter, patient had fallen and did not lay in the floor for any period of time.       Past Medical History:  Diagnosis Date   Hypertension     Patient Active Problem List   Diagnosis Date Noted   Rhabdomyolysis 07/07/2021   AMS (altered mental status) 07/07/2021   Hypokalemia 07/07/2021   Frequent falls 07/07/2021   Hypothyroidism 07/07/2021    History reviewed. No pertinent surgical history.  Prior to Admission medications   Medication Sig Start Date End Date Taking? Authorizing Provider  aspirin EC 81 MG tablet Take 81 mg by mouth every morning.    [provider]  atorvastatin (LIPITOR) 80 MG tablet Take 80 mg by mouth daily.    [provider]  levothyroxine (SYNTHROID, LEVOTHROID) 125 MCG tablet Take 125 mcg by mouth daily. Patient  not taking: Reported on 07/10/2021 05/26/15   [provider]  metoprolol tartrate (LOPRESSOR) 25 MG tablet Take 0.5 tablets (12.5 mg total) by mouth 2 (two) times daily. 06/18/21   Triplett, Cari B, FNP  nicotine (NICODERM CQ - DOSED IN MG/24 HOURS) 14 mg/24hr patch Place 1 patch (14 mg total) onto the skin daily. 07/11/21   Lynn ItoAmery, Sahar, MD    Allergies Fentanyl  No family history on file.  Social History Social History   Tobacco Use   Smoking status: Former    Packs/day: 0.50    Types: Cigarettes  Substance Use Topics   Alcohol use: Yes     Review of Systems  Constitutional: No fever/chills.  Positive for mechanical fall striking the back of his head with a laceration. Eyes: No visual changes. No discharge ENT: No upper respiratory complaints. Cardiovascular: no chest pain. Respiratory: no cough. No SOB. Gastrointestinal: No abdominal pain.  No nausea, no vomiting.  No diarrhea.  No constipation. Genitourinary: Negative for dysuria. No hematuria Musculoskeletal: Negative for musculoskeletal pain. Skin: Negative for rash, abrasions, lacerations, ecchymosis. Neurological: Negative for headaches, focal weakness or numbness.  10 System ROS otherwise negative.  ____________________________________________   PHYSICAL EXAM:  VITAL SIGNS: ED Triage Vitals  Enc Vitals Group     BP 07/27/21 1751 139/73     Pulse Rate 07/27/21 1751 (!) 55     Resp 07/27/21 1751 18     Temp 07/27/21 1751 98 F (36.7 C)  Temp src --      SpO2 07/27/21 1751 100 %     Weight --      Height --      Head Circumference --      Peak Flow --      Pain Score 07/27/21 1748 5     Pain Loc --      Pain Edu? --      Excl. in GC? --      Constitutional: Alert and oriented. Well appearing and in no acute distress. Eyes: Conjunctivae are normal. PERRL. EOMI. Head: Patient with roughly 4 cm laceration noted to the occipital skull.  Edges are well approximated at this time.  No active  bleeding.  No visible foreign body.  No large underlying hematoma.  Area is very tender to palpation.  This is the patient source of pain when he points to the back of his head.  No other visible signs of trauma to the head or face.  No other tenderness to palpation.  No crepitus to the skull.  No battle signs, raccoon eyes, serosanguineous fluid drainage from the ears or nares. ENT:      Ears:       Nose: No congestion/rhinnorhea.      Mouth/Throat: Mucous membranes are moist.  Neck: No stridor.  No cervical spine tenderness to palpation.  Full range of motion to the cervical spine at this time  Cardiovascular: Normal rate, regular rhythm. Normal S1 and S2.  Good peripheral circulation. Respiratory: Normal respiratory effort without tachypnea or retractions. Lungs CTAB. Good air entry to the bases with no decreased or absent breath sounds. Musculoskeletal: Full range of motion to all extremities. No gross deformities appreciated. Neurologic:  Normal speech and language. No gross focal neurologic deficits are appreciated.  Skin:  Skin is warm, dry and intact. No rash noted. Psychiatric: Mood and affect are normal. Speech and behavior are normal. Patient exhibits appropriate insight and judgement.   ____________________________________________   LABS (all labs ordered are listed, but only abnormal results are displayed)  Labs Reviewed - No data to display ____________________________________________  EKG   ____________________________________________  RADIOLOGY I personally viewed and evaluated these images as part of my medical decision making, as well as reviewing the written report by the radiologist.  ED Provider Interpretation: No acute traumatic finding on CT scan of the head or neck  CT HEAD WO CONTRAST ( )  Result Date: 07/27/2021 CLINICAL DATA:  Polytrauma, critical, head/C-spine injury suspected EXAM: CT HEAD WITHOUT CONTRAST CT CERVICAL SPINE WITHOUT CONTRAST TECHNIQUE:  Multidetector CT imaging of the head and cervical spine was performed following the standard protocol without intravenous contrast. Multiplanar CT image reconstructions of the cervical spine were also generated. COMPARISON:  CT head 07/08/2021.  CT cervical spine 07/06/2021. FINDINGS: CT HEAD FINDINGS Brain: No evidence of acute infarction, hemorrhage, hydrocephalus, extra-axial collection or mass lesion/mass effect. Similar moderate atrophy with ex vacuo ventricular dilation. Mild patchy white matter hypoattenuation, nonspecific but compatible with chronic microvascular ischemic disease. Vascular: Calcific atherosclerosis. No hyperdense vessel identified. Skull: Right posterior scalp contusion without acute fracture. Sinuses/Orbits: Clear visualized sinuses. No acute orbital findings. Other: Small bilateral mastoid effusions. CT CERVICAL SPINE FINDINGS Alignment: Similar alignment. Similar straightening without substantial sagittal subluxation. Skull base and vertebrae: Vertebral body heights are maintained. No evidence of acute fracture. Soft tissues and spinal canal: No prevertebral fluid or swelling. No visible canal hematoma. Disc levels:  Similar moderate multilevel degenerative change. Upper chest: Visualized lung apices are clear.  Other: Calcific atherosclerosis of bilateral carotid bifurcations. IMPRESSION: CT Head: 1. No evidence of acute intracranial abnormality. 2. Right posterior scalp contusion without acute fracture. 3. Moderate atrophy and mild chronic microvascular disease. CT Cervical Spine: 1. No evidence of acute fracture or traumatic malalignment. 2. Moderate multilevel degenerative change. Electronically Signed   By: Feliberto Harts M.D.   On: 07/27/2021 19:54   CT Cervical Spine Wo Contrast  Result Date: 07/27/2021 CLINICAL DATA:  Polytrauma, critical, head/C-spine injury suspected EXAM: CT HEAD WITHOUT CONTRAST CT CERVICAL SPINE WITHOUT CONTRAST TECHNIQUE: Multidetector CT imaging of the  head and cervical spine was performed following the standard protocol without intravenous contrast. Multiplanar CT image reconstructions of the cervical spine were also generated. COMPARISON:  CT head 07/08/2021.  CT cervical spine 07/06/2021. FINDINGS: CT HEAD FINDINGS Brain: No evidence of acute infarction, hemorrhage, hydrocephalus, extra-axial collection or mass lesion/mass effect. Similar moderate atrophy with ex vacuo ventricular dilation. Mild patchy white matter hypoattenuation, nonspecific but compatible with chronic microvascular ischemic disease. Vascular: Calcific atherosclerosis. No hyperdense vessel identified. Skull: Right posterior scalp contusion without acute fracture. Sinuses/Orbits: Clear visualized sinuses. No acute orbital findings. Other: Small bilateral mastoid effusions. CT CERVICAL SPINE FINDINGS Alignment: Similar alignment. Similar straightening without substantial sagittal subluxation. Skull base and vertebrae: Vertebral body heights are maintained. No evidence of acute fracture. Soft tissues and spinal canal: No prevertebral fluid or swelling. No visible canal hematoma. Disc levels:  Similar moderate multilevel degenerative change. Upper chest: Visualized lung apices are clear. Other: Calcific atherosclerosis of bilateral carotid bifurcations. IMPRESSION: CT Head: 1. No evidence of acute intracranial abnormality. 2. Right posterior scalp contusion without acute fracture. 3. Moderate atrophy and mild chronic microvascular disease. CT Cervical Spine: 1. No evidence of acute fracture or traumatic malalignment. 2. Moderate multilevel degenerative change. Electronically Signed   By: Feliberto Harts M.D.   On: 07/27/2021 19:54    ____________________________________________    PROCEDURES  Procedure(s) performed:    Marland KitchenMarland KitchenLaceration Repair  Date/Time: 07/27/2021 8:34 PM Performed by: Racheal Patches, PA-C Authorized by: Racheal Patches, PA-C   Consent:    Consent  obtained:  Verbal   Consent given by:  Patient   Risks discussed:  Infection Universal protocol:    Procedure explained and questions answered to patient or proxy's satisfaction: yes     Patient identity confirmed:  Verbally with patient Anesthesia:    Anesthesia method:  None Laceration details:    Location:  Scalp   Scalp location:  Occipital   Length (cm):  4 Exploration:    Imaging obtained comment:  CT scan   Imaging outcome: foreign body not noted     Wound exploration: wound explored through full range of motion and entire depth of wound visualized     Wound extent: no foreign bodies/material noted and no underlying fracture noted     Contaminated: no   Treatment:    Area cleansed with:  Saline Skin repair:    Repair method:  Staples   Number of staples:  2 Approximation:    Approximation:  Close Repair type:    Repair type:  Simple Post-procedure details:    Procedure completion:  Tolerated well, no immediate complications    Medications  HYDROcodone-acetaminophen (NORCO/VICODIN) 5-325 MG per tablet 1 tablet (has no administration in time range)     ____________________________________________   INITIAL IMPRESSION / ASSESSMENT AND PLAN / ED COURSE  Pertinent labs & imaging results that were available during my care of the patient were reviewed by  me and considered in my medical decision making (see chart for details).  Review of the [place] CSRS was performed in accordance of the [facility] prior to dispensing any controlled drugs.           [name] diagnosis is consistent with [place] laceration.  Patient presents emergency department after falling and striking his head on ground.  Reportedly had a loss of consciousness for less than 60 seconds.  Was neurologically intact on exam.  Only complaint was pain over the laceration to the occipital skull.  Imaging was reassuring.  Patient had no other complaints at this time.  2 staples placed for closure of his laceration.   Wound care instructions discussed with the patient.  Patient is stable for discharge at this time..  Patient is given ED precautions to return to the ED for any worsening or new symptoms.     ____________________________________________  FINAL CLINICAL IMPRESSION(S) / ED DIAGNOSES  Final diagnoses:  Fall, initial encounter  Laceration of scalp, initial encounter      NEW MEDICATIONS STARTED DURING THIS VISIT:  ED Discharge Orders     None           This chart was dictated using voice recognition software/Dragon. Despite best efforts to proofread, errors can occur which can change the meaning. Any change was purely unintentional.    Racheal Patches, PA-C 07/27/21 2036    Sharman Cheek, MD 07/27/21 2342

## 2021-07-27 NOTE — ED Notes (Signed)
Called [facility] to transport patient back to [facility]

## 2021-08-28 ENCOUNTER — Emergency Department: Payer: No Typology Code available for payment source

## 2021-08-28 ENCOUNTER — Other Ambulatory Visit: Payer: Self-pay

## 2021-08-28 ENCOUNTER — Encounter: Payer: Self-pay | Admitting: Emergency Medicine

## 2021-08-28 ENCOUNTER — Emergency Department
Admission: EM | Admit: 2021-08-28 | Discharge: 2021-08-28 | Disposition: A | Discharge status: Home / Self Care | Payer: No Typology Code available for payment source | Attending: Student in an Organized Health Care Education/Training Program | Admitting: Student in an Organized Health Care Education/Training Program

## 2021-08-28 DIAGNOSIS — R079 Chest pain, unspecified: Secondary | ICD-10-CM | POA: Diagnosis not present

## 2021-08-28 DIAGNOSIS — Z79899 Other long term (current) drug therapy: Secondary | ICD-10-CM | POA: Insufficient documentation

## 2021-08-28 DIAGNOSIS — Z7982 Long term (current) use of aspirin: Secondary | ICD-10-CM | POA: Insufficient documentation

## 2021-08-28 DIAGNOSIS — N3000 Acute cystitis without hematuria: Secondary | ICD-10-CM | POA: Diagnosis not present

## 2021-08-28 DIAGNOSIS — Z87891 Personal history of nicotine dependence: Secondary | ICD-10-CM | POA: Diagnosis not present

## 2021-08-28 DIAGNOSIS — E039 Hypothyroidism, unspecified: Secondary | ICD-10-CM | POA: Insufficient documentation

## 2021-08-28 DIAGNOSIS — R1031 Right lower quadrant pain: Secondary | ICD-10-CM | POA: Diagnosis present

## 2021-08-28 DIAGNOSIS — Y900 Blood alcohol level of less than 20 mg/100 ml: Secondary | ICD-10-CM | POA: Diagnosis not present

## 2021-08-28 DIAGNOSIS — I10 Essential (primary) hypertension: Secondary | ICD-10-CM | POA: Diagnosis not present

## 2021-08-28 DIAGNOSIS — R4182 Altered mental status, unspecified: Secondary | ICD-10-CM | POA: Diagnosis not present

## 2021-08-28 DIAGNOSIS — K4090 Unilateral inguinal hernia, without obstruction or gangrene, not specified as recurrent: Secondary | ICD-10-CM | POA: Diagnosis not present

## 2021-08-28 LAB — COMPREHENSIVE METABOLIC PANEL
ALT: 26 U/L (ref 0–44)
AST: 27 U/L (ref 15–41)
Albumin: 3.8 g/dL (ref 3.5–5.0)
Alkaline Phosphatase: 42 U/L (ref 38–126)
Anion gap: 8 (ref 5–15)
BUN: 16 mg/dL (ref 8–23)
CO2: 29 mmol/L (ref 22–32)
Calcium: 8.6 mg/dL — ABNORMAL LOW (ref 8.9–10.3)
Chloride: 101 mmol/L (ref 98–111)
Creatinine, Ser: 0.67 mg/dL (ref 0.61–1.24)
GFR, Estimated: >60 mL/min (ref >60)
Glucose, Bld: 115 mg/dL — ABNORMAL HIGH (ref 70–99)
Potassium: 3.7 mmol/L (ref 3.5–5.1)
Sodium: 138 mmol/L (ref 135–145)
Total Bilirubin: 0.6 mg/dL (ref 0.3–1.2)
Total Protein: 7.6 g/dL (ref 6.5–8.1)

## 2021-08-28 LAB — CBC
HCT: 33.7 % — ABNORMAL LOW (ref 39.0–52.0)
Hemoglobin: 11.7 g/dL — ABNORMAL LOW (ref 13.0–17.0)
MCH: 34.7 pg — ABNORMAL HIGH (ref 26.0–34.0)
MCHC: 34.7 g/dL (ref 30.0–36.0)
MCV: 100 fL (ref 80.0–100.0)
Platelets: 278 10*3/uL (ref 150–400)
RBC: 3.37 MIL/uL — ABNORMAL LOW (ref 4.22–5.81)
RDW: 14.6 % (ref 11.5–15.5)
WBC: 6.1 10*3/uL (ref 4.0–10.5)
nRBC: 0 % (ref 0.0–0.2)

## 2021-08-28 LAB — URINE DRUG SCREEN, QUALITATIVE (ARMC ONLY)
Amphetamines, Ur Screen: NOT DETECTED
Barbiturates, Ur Screen: NOT DETECTED
Benzodiazepine, Ur Scrn: NOT DETECTED
Cannabinoid 50 Ng, Ur ~~LOC~~: NOT DETECTED
Cocaine Metabolite,Ur ~~LOC~~: NOT DETECTED
MDMA (Ecstasy)Ur Screen: NOT DETECTED
Methadone Scn, Ur: NOT DETECTED
Opiate, Ur Screen: POSITIVE — AB
Phencyclidine (PCP) Ur S: NOT DETECTED
Tricyclic, Ur Screen: NOT DETECTED

## 2021-08-28 LAB — TROPONIN I (HIGH SENSITIVITY)
Troponin I (High Sensitivity): 8 ng/L (ref <18)
Troponin I (High Sensitivity): 7 ng/L (ref <18)

## 2021-08-28 LAB — URINALYSIS, COMPLETE (UACMP) WITH MICROSCOPIC
Bacteria, UA: NONE SEEN
Bilirubin Urine: NEGATIVE
Glucose, UA: NEGATIVE mg/dL
Ketones, ur: NEGATIVE mg/dL
Nitrite: POSITIVE — AB
Protein, ur: 100 mg/dL — AB
Specific Gravity, Urine: 1.018 (ref 1.005–1.030)
Squamous Epithelial / HPF: NONE SEEN (ref 0–5)
WBC, UA: >50 WBC/hpf — ABNORMAL HIGH (ref 0–5)
pH: 6 (ref 5.0–8.0)

## 2021-08-28 LAB — LIPASE, BLOOD: Lipase: 28 U/L (ref 11–51)

## 2021-08-28 LAB — ETHANOL: Alcohol, Ethyl (B): <10 mg/dL (ref <10)

## 2021-08-28 LAB — AMMONIA: Ammonia: <10 umol/L (ref 9–35)

## 2021-08-28 MED ORDER — MORPHINE SULFATE (PF) 4 MG/ML IV SOLN
4.0000 mg | INTRAVENOUS | Status: DC | PRN
  Administered 2021-08-28: 4 mg via INTRAVENOUS
  Filled 2021-08-28: qty 1

## 2021-08-28 MED ORDER — CEPHALEXIN 500 MG PO CAPS
500.0000 mg | ORAL_CAPSULE | Freq: Once | ORAL | Status: AC
  Administered 2021-08-28: 500 mg via ORAL
  Filled 2021-08-28: qty 1

## 2021-08-28 MED ORDER — ONDANSETRON HCL 4 MG/2ML IJ SOLN
4.0000 mg | Freq: Once | INTRAMUSCULAR | Status: AC
  Administered 2021-08-28: 4 mg via INTRAVENOUS
  Filled 2021-08-28: qty 2

## 2021-08-28 MED ORDER — CEPHALEXIN 500 MG PO CAPS: 500.0000 mg | ORAL_CAPSULE | Freq: Three times a day (TID) | ORAL | 0 refills | Status: AC

## 2021-08-28 NOTE — ED Notes (Signed)
This RN attempted to contact pt daughter to find out pt baseline. No answer at number listed at this time.

## 2021-08-28 NOTE — ED Notes (Signed)
EDP was at bedside examining pt. Pt has large hernia in RLQ of abdomen/groin area. PT states it is painful. See new orders.

## 2021-08-28 NOTE — ED Notes (Signed)
First contact: introduced self to patient. Noted to be in supine position s/p hernia reduction. Pt alert to self, denies other needs at this time   Call light in reach.

## 2021-08-28 NOTE — ED Notes (Signed)
Pt OTF with CT 
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
## 2021-08-28 NOTE — ED Triage Notes (Signed)
Pt via EMS from home. Pt c/o CP and LLQ pain. Pt does endorse nausea but denies vomiting. Denies diarrhea.Pt does have a hx of hernia. Unknown what pt's baseline in.    Pt is confused, alert, but oriented to only self.

## 2021-08-28 NOTE — ED Notes (Signed)
Report given to Reggie RN at [facility].

## 2021-08-28 NOTE — ED Notes (Signed)
Middle for Triage

## 2021-08-28 NOTE — ED Notes (Addendum)
RN sarah noted to have removed patient from all VS monitoring & primofit. Pt incontinent of urine. Full bed change performed. Pt placed in brief, assisted with urinal for one episode of urin 200cc.   Denies other needs at this time. Call light in reach.

## 2021-09-20 ENCOUNTER — Ambulatory Visit: Payer: Self-pay | Admitting: Surgery

## 2021-09-20 ENCOUNTER — Ambulatory Visit (INDEPENDENT_AMBULATORY_CARE_PROVIDER_SITE_OTHER): Payer: Medicaid Other | Admitting: Surgery

## 2021-09-20 ENCOUNTER — Encounter: Payer: Self-pay | Admitting: Surgery

## 2021-09-20 ENCOUNTER — Other Ambulatory Visit: Payer: Self-pay

## 2021-09-20 ENCOUNTER — Telehealth: Payer: Self-pay | Admitting: Surgery

## 2021-09-20 VITALS — BP 125/68 | HR 65 | Temp 98.0°F | Ht 71.0 in | Wt 148.2 lb

## 2021-09-20 DIAGNOSIS — K4090 Unilateral inguinal hernia, without obstruction or gangrene, not specified as recurrent: Secondary | ICD-10-CM | POA: Diagnosis not present

## 2021-09-20 NOTE — Patient Instructions (Signed)
You have chose to have your hernia repaired. This will be done by Dr. Claudine Mouton at [facility].  Please see your (blue) Pre-care information that you have been given today. Our surgery scheduler will call to go over surgery dates and to go over surgery information.   You will need to arrange to be out of work for 2 weeks and then return with a lifting restrictions for 4 more weeks. Please send any FMLA paperwork prior to surgery and we will fill this out and fax it back to your employer within 3 business days.  You may have a bruise in your groin and also swelling and brusing in your testicle area. You may use ice 4-5 times daily for 15-20 minutes each time. Make sure that you place a barrier between you and the ice pack. To decrease the swelling, you may roll up a bath towel and place it vertically in between your thighs with your testicles resting on the towel. You will want to keep this area elevated as much as possible for several days following surgery.    Inguinal Hernia, Adult Muscles help keep everything in the body in its proper place. But if a weak spot in the muscles develops, something can poke through. That is called a hernia. When this happens in the lower part of the belly (abdomen), it is called an inguinal hernia. (It takes its name from a part of the body in this region called the inguinal canal.) A weak spot in the wall of muscles lets some fat or part of the small intestine bulge through. An inguinal hernia can develop at any age. Men get them more often than women. CAUSES  In adults, an inguinal hernia develops over time. It can be triggered by: Suddenly straining the muscles of the lower abdomen. Lifting heavy objects. Straining to have a bowel movement. Difficult bowel movements (constipation) can lead to this. Constant coughing. This may be caused by smoking or lung disease. Being overweight. Being pregnant. Working at a job that requires long periods of standing or heavy  lifting. Having had an inguinal hernia before. One type can be an emergency situation. It is called a strangulated inguinal hernia. It develops if part of the small intestine slips through the weak spot and cannot get back into the abdomen. The blood supply can be cut off. If that happens, part of the intestine may die. This situation requires emergency surgery. SYMPTOMS  Often, a small inguinal hernia has no symptoms. It is found when a healthcare provider does a physical exam. Larger hernias usually have symptoms.  In adults, symptoms may include: A lump in the groin. This is easier to see when the person is standing. It might disappear when lying down. In men, a lump in the scrotum. Pain or burning in the groin. This occurs especially when lifting, straining or coughing. A dull ache or feeling of pressure in the groin. Signs of a strangulated hernia can include: A bulge in the groin that becomes very painful and tender to the touch. A bulge that turns red or purple. Fever, nausea and vomiting. Inability to have a bowel movement or to pass gas. DIAGNOSIS  To decide if you have an inguinal hernia, a healthcare provider will probably do a physical examination. This will include asking questions about any symptoms you have noticed. The healthcare provider might feel the groin area and ask you to cough. If an inguinal hernia is felt, the healthcare provider may try to slide it back  into the abdomen. Usually no other tests are needed. TREATMENT  Treatments can vary. The size of the hernia makes a difference. Options include: Watchful waiting. This is often suggested if the hernia is small and you have had no symptoms. No medical procedure will be done unless symptoms develop. You will need to watch closely for symptoms. If any occur, contact your healthcare provider right away. Surgery. This is used if the hernia is larger or you have symptoms. Open surgery. This is usually an outpatient  procedure (you will not stay overnight in a hospital). An cut (incision) is made through the skin in the groin. The hernia is put back inside the abdomen. The weak area in the muscles is then repaired by herniorrhaphy or hernioplasty. Herniorrhaphy: in this type of surgery, the weak muscles are sewn back together. Hernioplasty: a patch or mesh is used to close the weak area in the abdominal wall. Laparoscopy. In this procedure, a surgeon makes small incisions. A thin tube with a tiny video camera (called a laparoscope) is put into the abdomen. The surgeon repairs the hernia with mesh by looking with the video camera and using two long instruments. HOME CARE INSTRUCTIONS  After surgery to repair an inguinal hernia: You will need to take pain medicine prescribed by your healthcare provider. Follow all directions carefully. You will need to take care of the wound from the incision. Your activity will be restricted for awhile. This will probably include no heavy lifting for several weeks. You also should not do anything too active for a few weeks. When you can return to work will depend on the type of job that you have. During "watchful waiting" periods, you should: Maintain a healthy weight. Eat a diet high in fiber (fruits, vegetables and whole grains). Drink plenty of fluids to avoid constipation. This means drinking enough water and other liquids to keep your urine clear or pale yellow. Do not lift heavy objects. Do not stand for long periods of time. Quit smoking. This should keep you from developing a frequent cough. SEEK MEDICAL CARE IF:  A bulge develops in your groin area. You feel pain, a burning sensation or pressure in the groin. This might be worse if you are lifting or straining. You develop a fever of more than 100.5 F (38.1 C). SEEK IMMEDIATE MEDICAL CARE IF:  Pain in the groin increases suddenly. A bulge in the groin gets bigger suddenly and does not go down. For men, there is  sudden pain in the scrotum. Or, the size of the scrotum increases. A bulge in the groin area becomes red or purple and is painful to touch. You have nausea or vomiting that does not go away. You feel your heart beating much faster than normal. You cannot have a bowel movement or pass gas. You develop a fever of more than 102.0 F (38.9 C).   This information is not intended to replace advice given to you by your health care provider. Make sure you discuss any questions you have with your health care provider.   Document Released: 03/23/2009 Document Revised: 01/27/2012 Document Reviewed: 05/08/2015 Elsevier Interactive Patient Education [employer].

## 2021-09-20 NOTE — Progress Notes (Signed)
Patient ID: William Schwartz, male   DOB: 1946-06-15, 75 y.o.   MRN: [id number]  Chief Complaint: Right inguinal hernia  History of Present Illness William Schwartz is a 75 y.o. male with we have sounds like a long history of a right inguinal hernia, had more recently got his attention with increased bulging, some degree of redness perhaps from the tension.  Not certain what provoked the worsening of his hernia symptoms, but he reports the pain is significantly better today.  He reports his bowel movements are back to his baseline.  He reported a lot of his right groin and right hip symptoms feeling they were related to his prior right knee repair/replacement. 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  Past Medical History Past Medical History:  Diagnosis Date   Cognitive communication deficit    Hyperlipidemia    Hypertension       Past Surgical History:  Procedure Laterality Date   JOINT REPLACEMENT      Allergies  Allergen Reactions   Fentanyl     Other reaction(s): Low blood pressure    Current Outpatient Medications  Medication Sig Dispense Refill   aspirin EC 81 MG tablet Take 81 mg by mouth every morning.     atorvastatin (LIPITOR) 80 MG tablet Take 80 mg by mouth daily.     cephALEXin (KEFLEX) 500 MG capsule Take 500 mg by mouth 2 (two) times daily.     levothyroxine (SYNTHROID, LEVOTHROID) 125 MCG tablet Take 125 mcg by mouth daily.  3   metoprolol tartrate (LOPRESSOR) 25 MG tablet Take 0.5 tablets (12.5 mg total) by mouth 2 (two) times daily. 15 tablet 0   nicotine (NICODERM CQ - DOSED IN MG/24 HOURS) 14 mg/24hr patch Place 1 patch (14 mg total) onto the skin daily. 28 patch 0   No current facility-administered medications for this visit.    Family History No family history on file.    Social History Social History   Tobacco Use   Smoking status: Former    Packs/day: 0.50    Types:  Cigarettes   Smokeless tobacco: Never  Substance Use Topics   Alcohol use: Yes   Drug use: Never        Review of Systems  Unable to perform ROS: Dementia    Physical Exam Blood pressure 125/68, pulse 65, temperature 98 F (36.7 C), height 5\' 11"  (1.803 m), weight 148 lb 3.2 oz (67.2 kg), SpO2 98 %. Last Weight  Most recent update: 09/20/2021  9:26 AM    Weight  67.2 kg (148 lb 3.2 oz)             CONSTITUTIONAL: Well developed, and nourished, appropriately responsive and aware without distress.   EYES: Sclera non-icteric.   EARS, NOSE, MOUTH AND THROAT: Mask worn.   The oropharynx is clear. Oral mucosa is pink and moist.   Hearing is intact to voice.  NECK: Trachea is midline, and there is no jugular venous distension.  LYMPH NODES:  Lymph nodes in the neck are not enlarged. RESPIRATORY:  Lungs are clear, and breath sounds are equal bilaterally. Normal respiratory effort without pathologic use of accessory muscles. CARDIOVASCULAR: Heart is regular in rate and rhythm. GI: The abdomen is soft, nontender, and nondistended. There were no palpable masses. I did not appreciate hepatosplenomegaly. There were normal bowel sounds. GU: Obvious [place] right groin bulge consistent with hernia.  No appreciable  hernia on the opposite side.  Limited exam. MUSCULOSKELETAL:  Symmetrical muscle tone appreciated in all four extremities.    SKIN: Skin turgor is normal. No pathologic skin lesions appreciated.  NEUROLOGIC:  Motor and sensation appear grossly normal.  Cranial nerves are grossly without defect. PSYCH:  Alert and oriented to person, place and time. Affect is appropriate for situation.  Repetitive in communication, often repeating what I said in response.  Data Reviewed I have personally reviewed what is currently available of the patient's imaging, recent labs and medical records.   Labs:  CBC Latest Ref Rng & Units 08/28/2021 07/22/2021 07/08/2021  WBC 4.0 - 10.5 K/uL 6.1 8.6 8.6   Hemoglobin 13.0 - 17.0 g/dL 11.7(L) 10.4(L) 12.3(L)  Hematocrit 39.0 - 52.0 % 33.7(L) 30.8(L) 36.1(L)  Platelets 150 - 400 K/uL 278 309 240   CMP Latest Ref Rng & Units 08/28/2021 07/22/2021 07/08/2021  Glucose 70 - 99 mg/dL 115(H) 141(H) 83  BUN 8 - 23 mg/dL 16 14 14   Creatinine 0.61 - 1.24 mg/dL 0.67 0.81 0.80  Sodium 135 - 145 mmol/L 138 135 140  Potassium 3.5 - 5.1 mmol/L 3.7 3.8 3.5  Chloride 98 - 111 mmol/L 101 101 104  CO2 22 - 32 mmol/L 29 28 31   Calcium 8.9 - 10.3 mg/dL 8.6(L) 8.2(L) 8.2(L)  Total Protein 6.5 - 8.1 g/dL 7.6 - -  Total Bilirubin 0.3 - 1.2 mg/dL 0.6 - -  Alkaline Phos 38 - 126 U/L 42 - -  AST 15 - 41 U/L 27 - -  ALT 0 - 44 U/L 26 - -      Imaging:  Within last 24 hrs: No results found.  Assessment    Right inguinal hernia, reducible. Patient Active Problem List   Diagnosis Date Noted   Rhabdomyolysis 07/07/2021   AMS (altered mental status) 07/07/2021   Hypokalemia 07/07/2021   Frequent falls 07/07/2021   Hypothyroidism 07/07/2021    Plan    Robotic right inguinal hernia repair. I discussed possibility of incarceration, strangulation, enlargement in size over time, and the need for emergency surgery in the face of these.  Also reviewed the techniques of reduction should incarceration occur, and when unsuccessful to present to the ED.  Also discussed that surgery risks include recurrence which can be up to 30% in the case of complex hernias, use of prosthetic materials (mesh) and the increased risk of infection and the possible need for re-operation and removal of mesh, possibility of post-op SBO or ileus, and the risks of general anesthetic including heart attack, stroke, sudden death or some reaction to anesthetic medications. The patient, and those present, appear to understand the risks, any and all questions were answered to the patient's satisfaction.  No guarantees were ever expressed or implied.    Face-to-face time spent with the patient and  accompanying care providers(if present) was 30 minutes, with more than 50% of the time spent counseling, educating, and coordinating care of the patient.    These notes generated with voice recognition software. I apologize for typographical errors.  Ronny Bacon M.D., FACS 09/20/2021, 9:30 AM

## 2021-09-20 NOTE — Telephone Encounter (Signed)
Outgoing call is made to daughter, Elmarie Shiley to provide surgery information.  When asked if she wanted to take down the information, she said "I don't need it".  So surgery information is not provided to her as she didn't want it.  Did not quite understand this as she is primary contact for her father.      Called [facility], after several transfers, spoke with nurse Persina P. And she was given the information regarding patient's surgery as follows and verbalized understanding.      Patient has been advised of Pre-Admission date/time, COVID Testing date and Surgery date.  Surgery Date: 10/03/21 Preadmission Testing Date: 09/27/21 (phone 1p-5p) Covid Testing Date: Not needed.   [facility] also informed to call at  [phone], between 1-3:00pm the day before surgery, to find out what time to arrive for surgery.

## 2021-09-20 NOTE — H&P (View-Only) (Signed)
Patient ID: William Schwartz, male   DOB: 1946-06-15, 75 y.o.   MRN: [id number]  Chief Complaint: Right inguinal hernia  History of Present Illness William Schwartz is a 75 y.o. male with we have sounds like a long history of a right inguinal hernia, had more recently got his attention with increased bulging, some degree of redness perhaps from the tension.  Not certain what provoked the worsening of his hernia symptoms, but he reports the pain is significantly better today.  He reports his bowel movements are back to his baseline.  He reported a lot of his right groin and right hip symptoms feeling they were related to his prior right knee repair/replacement. 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  Past Medical History Past Medical History:  Diagnosis Date   Cognitive communication deficit    Hyperlipidemia    Hypertension       Past Surgical History:  Procedure Laterality Date   JOINT REPLACEMENT      Allergies  Allergen Reactions   Fentanyl     Other reaction(s): Low blood pressure    Current Outpatient Medications  Medication Sig Dispense Refill   aspirin EC 81 MG tablet Take 81 mg by mouth every morning.     atorvastatin (LIPITOR) 80 MG tablet Take 80 mg by mouth daily.     cephALEXin (KEFLEX) 500 MG capsule Take 500 mg by mouth 2 (two) times daily.     levothyroxine (SYNTHROID, LEVOTHROID) 125 MCG tablet Take 125 mcg by mouth daily.  3   metoprolol tartrate (LOPRESSOR) 25 MG tablet Take 0.5 tablets (12.5 mg total) by mouth 2 (two) times daily. 15 tablet 0   nicotine (NICODERM CQ - DOSED IN MG/24 HOURS) 14 mg/24hr patch Place 1 patch (14 mg total) onto the skin daily. 28 patch 0   No current facility-administered medications for this visit.    Family History No family history on file.    Social History Social History   Tobacco Use   Smoking status: Former    Packs/day: 0.50    Types:  Cigarettes   Smokeless tobacco: Never  Substance Use Topics   Alcohol use: Yes   Drug use: Never        Review of Systems  Unable to perform ROS: Dementia    Physical Exam Blood pressure 125/68, pulse 65, temperature 98 F (36.7 C), height 5\' 11"  (1.803 m), weight 148 lb 3.2 oz (67.2 kg), SpO2 98 %. Last Weight  Most recent update: 09/20/2021  9:26 AM    Weight  67.2 kg (148 lb 3.2 oz)             CONSTITUTIONAL: Well developed, and nourished, appropriately responsive and aware without distress.   EYES: Sclera non-icteric.   EARS, NOSE, MOUTH AND THROAT: Mask worn.   The oropharynx is clear. Oral mucosa is pink and moist.   Hearing is intact to voice.  NECK: Trachea is midline, and there is no jugular venous distension.  LYMPH NODES:  Lymph nodes in the neck are not enlarged. RESPIRATORY:  Lungs are clear, and breath sounds are equal bilaterally. Normal respiratory effort without pathologic use of accessory muscles. CARDIOVASCULAR: Heart is regular in rate and rhythm. GI: The abdomen is soft, nontender, and nondistended. There were no palpable masses. I did not appreciate hepatosplenomegaly. There were normal bowel sounds. GU: Obvious widemouth right groin bulge consistent with hernia.  No appreciable  hernia on the opposite side.  Limited exam. MUSCULOSKELETAL:  Symmetrical muscle tone appreciated in all four extremities.    SKIN: Skin turgor is normal. No pathologic skin lesions appreciated.  NEUROLOGIC:  Motor and sensation appear grossly normal.  Cranial nerves are grossly without defect. PSYCH:  Alert and oriented to person, place and time. Affect is appropriate for situation.  Repetitive in communication, often repeating what I said in response.  Data Reviewed I have personally reviewed what is currently available of the patient's imaging, recent labs and medical records.   Labs:  CBC Latest Ref Rng & Units 08/28/2021 07/22/2021 07/08/2021  WBC 4.0 - 10.5 K/uL 6.1 8.6 8.6   Hemoglobin 13.0 - 17.0 g/dL 11.7(L) 10.4(L) 12.3(L)  Hematocrit 39.0 - 52.0 % 33.7(L) 30.8(L) 36.1(L)  Platelets 150 - 400 K/uL 278 309 240   CMP Latest Ref Rng & Units 08/28/2021 07/22/2021 07/08/2021  Glucose 70 - 99 mg/dL 115(H) 141(H) 83  BUN 8 - 23 mg/dL 16 14 14   Creatinine 0.61 - 1.24 mg/dL 0.67 0.81 0.80  Sodium 135 - 145 mmol/L 138 135 140  Potassium 3.5 - 5.1 mmol/L 3.7 3.8 3.5  Chloride 98 - 111 mmol/L 101 101 104  CO2 22 - 32 mmol/L 29 28 31   Calcium 8.9 - 10.3 mg/dL 8.6(L) 8.2(L) 8.2(L)  Total Protein 6.5 - 8.1 g/dL 7.6 - -  Total Bilirubin 0.3 - 1.2 mg/dL 0.6 - -  Alkaline Phos 38 - 126 U/L 42 - -  AST 15 - 41 U/L 27 - -  ALT 0 - 44 U/L 26 - -      Imaging:  Within last 24 hrs: No results found.  Assessment    Right inguinal hernia, reducible. Patient Active Problem List   Diagnosis Date Noted   Rhabdomyolysis 07/07/2021   AMS (altered mental status) 07/07/2021   Hypokalemia 07/07/2021   Frequent falls 07/07/2021   Hypothyroidism 07/07/2021    Plan    Robotic right inguinal hernia repair. I discussed possibility of incarceration, strangulation, enlargement in size over time, and the need for emergency surgery in the face of these.  Also reviewed the techniques of reduction should incarceration occur, and when unsuccessful to present to the ED.  Also discussed that surgery risks include recurrence which can be up to 30% in the case of complex hernias, use of prosthetic materials (mesh) and the increased risk of infection and the possible need for re-operation and removal of mesh, possibility of post-op SBO or ileus, and the risks of general anesthetic including heart attack, stroke, sudden death or some reaction to anesthetic medications. The patient, and those present, appear to understand the risks, any and all questions were answered to the patient's satisfaction.  No guarantees were ever expressed or implied.    Face-to-face time spent with the patient and  accompanying care providers(if present) was 30 minutes, with more than 50% of the time spent counseling, educating, and coordinating care of the patient.    These notes generated with voice recognition software. I apologize for typographical errors.  Ronny Bacon M.D., FACS 09/20/2021, 9:30 AM

## 2021-09-27 ENCOUNTER — Inpatient Hospital Stay: Admission: RE | Admit: 2021-09-27 | Payer: No Typology Code available for payment source | Source: Ambulatory Visit

## 2021-10-01 ENCOUNTER — Encounter
Admission: RE | Admit: 2021-10-01 | Discharge: 2021-10-01 | Disposition: A | Discharge status: Home / Self Care | Payer: No Typology Code available for payment source | Source: Ambulatory Visit | Attending: Surgery | Admitting: Surgery

## 2021-10-01 NOTE — Pre-Procedure Instructions (Addendum)
Multiple calls to [facility] to do pre-op interview. Unable to speak with anyone that could provide the information. A copy of pre-surgery instructions was faxed to [phone]. Call to daughter Roosvelt Maser to obtain consent for surgery; message left. Daughter, Roosvelt Maser (medical POA) returned call. Surgical consent obtained via telephone with 2 RN's as witness.

## 2021-10-01 NOTE — Patient Instructions (Addendum)
Your procedure is scheduled on: Wednesday, November 16 Report to the Registration Desk on the 1st floor of the [facility] To find out your arrival time, please call [phone] between 1PM - 3PM on: Tuesday, November 15  REMEMBER: Instructions that are not followed completely may result in serious medical risk, up to and including death; or upon the discretion of your surgeon and anesthesiologist your surgery may need to be rescheduled.  Do not eat food after midnight the night before surgery.  No gum chewing, lozengers or hard candies.  You may however, drink CLEAR liquids up to 2 hours before you are scheduled to arrive for your surgery. Do not drink anything within 2 hours of your scheduled arrival time.  Clear liquids include: - water  - apple juice without pulp - gatorade (not RED, PURPLE, OR BLUE) - black coffee or tea (Do NOT add milk or creamers to the coffee or tea) Do NOT drink anything that is not on this list.  TAKE THESE MEDICATIONS THE MORNING OF SURGERY WITH A SIP OF WATER:  Levothyroxine Metoprolol  One week prior to surgery: Stop Anti-inflammatories (NSAIDS) such as Advil, Aleve, Ibuprofen, Motrin, Naproxen, Naprosyn and Aspirin based products such as Excedrin, Goodys Powder, BC Powder. Stop ANY OVER THE COUNTER supplements until after surgery. You may however, continue to take Tylenol if needed for pain up until the day of surgery.  No Alcohol for 24 hours before or after surgery.  No Smoking including e-cigarettes for 24 hours prior to surgery.  No chewable tobacco products for at least 6 hours prior to surgery.  No nicotine patches on the day of surgery.  Do not use any "recreational" drugs for at least a week prior to your surgery.  Please be advised that the combination of cocaine and anesthesia may have negative outcomes, up to and including death. If you test positive for cocaine, your surgery will be cancelled.  On the morning of surgery brush  your teeth with toothpaste and water, you may rinse your mouth with mouthwash if you wish. Do not swallow any toothpaste or mouthwash.  Shower using antibacterial soap prior to coming to the hospital on the day of surgery.  Do not wear jewelry, make-up, hairpins, clips or nail polish.  Do not wear lotions, powders, or perfumes.   Do not shave body from the neck down 48 hours prior to surgery just in case you cut yourself which could leave a site for infection.  Also, freshly shaved skin may become irritated if using the CHG soap.  Contact lenses, hearing aids and dentures may not be worn into surgery.  Do not bring valuables to the hospital. [facility] is not responsible for any missing/lost belongings or valuables.   Notify your doctor if there is any change in your medical condition (cold, fever, infection).  Wear comfortable clothing (specific to your surgery type) to the hospital.  After surgery, you can help prevent lung complications by doing breathing exercises.  Take deep breaths and cough every 1-2 hours. Your doctor may order a device called an Incentive Spirometer to help you take deep breaths. When coughing or sneezing, hold a pillow firmly against your incision with both hands. This is called "splinting." Doing this helps protect your incision. It also decreases belly discomfort.  If you are being discharged the day of surgery, you will not be allowed to drive home. You will need a responsible adult (18 years or older) to drive you home and stay with  you that night.   If you are taking public transportation, you will need to have a responsible adult (18 years or older) with you. Please confirm with your physician that it is acceptable to use public transportation.   Please call the Pre-admissions Testing Dept. at [phone] if you have any questions about these instructions.  Surgery Visitation Policy:  Patients undergoing a surgery or procedure may have one family  member or support person with them as long as that person is not COVID-19 positive or experiencing its symptoms.  That person may remain in the waiting area during the procedure and may rotate out with other people.

## 2021-10-03 ENCOUNTER — Ambulatory Visit: Payer: Medicaid Other

## 2021-10-03 ENCOUNTER — Ambulatory Visit
Admission: RE | Admit: 2021-10-03 | Discharge: 2021-10-03 | Disposition: A | Discharge status: Skilled Nursing Facility | Payer: Medicaid Other | Attending: Surgery | Admitting: Surgery

## 2021-10-03 ENCOUNTER — Encounter
Admission: RE | Disposition: A | Discharge status: Skilled Nursing Facility | Payer: Self-pay | Source: Home / Self Care | Attending: Surgery

## 2021-10-03 ENCOUNTER — Encounter: Payer: Self-pay | Admitting: Surgery

## 2021-10-03 ENCOUNTER — Other Ambulatory Visit: Payer: Self-pay

## 2021-10-03 DIAGNOSIS — K4090 Unilateral inguinal hernia, without obstruction or gangrene, not specified as recurrent: Secondary | ICD-10-CM | POA: Diagnosis present

## 2021-10-03 DIAGNOSIS — F8089 Other developmental disorders of speech and language: Secondary | ICD-10-CM | POA: Diagnosis not present

## 2021-10-03 DIAGNOSIS — Z79899 Other long term (current) drug therapy: Secondary | ICD-10-CM | POA: Diagnosis not present

## 2021-10-03 DIAGNOSIS — I10 Essential (primary) hypertension: Secondary | ICD-10-CM | POA: Insufficient documentation

## 2021-10-03 DIAGNOSIS — Z87891 Personal history of nicotine dependence: Secondary | ICD-10-CM | POA: Diagnosis not present

## 2021-10-03 HISTORY — PX: INSERTION OF MESH: SHX5868

## 2021-10-03 SURGERY — HERNIORRHAPHY, INGUINAL, ROBOT-ASSISTED, LAPAROSCOPIC
Anesthesia: General | Laterality: Right

## 2021-10-03 MED ORDER — GABAPENTIN 300 MG PO CAPS: 300.0000 mg | ORAL_CAPSULE | ORAL | Status: AC

## 2021-10-03 MED ORDER — LIDOCAINE HCL (CARDIAC) PF 100 MG/5ML IV SOSY
PREFILLED_SYRINGE | INTRAVENOUS | Status: DC | PRN
  Administered 2021-10-03: 80 mg via INTRAVENOUS
  Administered 2021-10-03: 150 mg via INTRAVENOUS

## 2021-10-03 MED ORDER — FAMOTIDINE 20 MG PO TABS: 20.0000 mg | ORAL_TABLET | Freq: Once | ORAL | Status: AC

## 2021-10-03 MED ORDER — SUGAMMADEX SODIUM 200 MG/2ML IV SOLN
INTRAVENOUS | Status: DC | PRN
  Administered 2021-10-03: 200 mg via INTRAVENOUS

## 2021-10-03 MED ORDER — PHENYLEPHRINE HCL-NACL 20-0.9 MG/250ML-% IV SOLN
INTRAVENOUS | Status: AC
  Filled 2021-10-03: qty 250

## 2021-10-03 MED ORDER — CEFAZOLIN SODIUM-DEXTROSE 2-4 GM/100ML-% IV SOLN
2.0000 g | INTRAVENOUS | Status: AC
  Administered 2021-10-03: 2 g via INTRAVENOUS

## 2021-10-03 MED ORDER — ONDANSETRON HCL 4 MG/2ML IJ SOLN
INTRAMUSCULAR | Status: DC | PRN
  Administered 2021-10-03: 4 mg via INTRAVENOUS

## 2021-10-03 MED ORDER — ROCURONIUM BROMIDE 100 MG/10ML IV SOLN
INTRAVENOUS | Status: DC | PRN
  Administered 2021-10-03: 60 mg via INTRAVENOUS

## 2021-10-03 MED ORDER — CELECOXIB 200 MG PO CAPS
ORAL_CAPSULE | ORAL | Status: AC
  Administered 2021-10-03: 200 mg via ORAL
  Filled 2021-10-03: qty 1

## 2021-10-03 MED ORDER — PROPOFOL 10 MG/ML IV BOLUS
INTRAVENOUS | Status: AC
  Filled 2021-10-03: qty 40

## 2021-10-03 MED ORDER — DEXAMETHASONE SODIUM PHOSPHATE 10 MG/ML IJ SOLN
INTRAMUSCULAR | Status: DC | PRN
  Administered 2021-10-03: 10 mg via INTRAVENOUS

## 2021-10-03 MED ORDER — ROCURONIUM BROMIDE 10 MG/ML (PF) SYRINGE
PREFILLED_SYRINGE | INTRAVENOUS | Status: AC
  Filled 2021-10-03: qty 10

## 2021-10-03 MED ORDER — CEFAZOLIN SODIUM-DEXTROSE 2-4 GM/100ML-% IV SOLN
INTRAVENOUS | Status: AC
  Filled 2021-10-03: qty 100

## 2021-10-03 MED ORDER — CHLORHEXIDINE GLUCONATE CLOTH 2 % EX PADS: 6.0000 | MEDICATED_PAD | Freq: Once | CUTANEOUS | Status: DC

## 2021-10-03 MED ORDER — FENTANYL CITRATE (PF) 100 MCG/2ML IJ SOLN
INTRAMUSCULAR | Status: AC
  Filled 2021-10-03: qty 2

## 2021-10-03 MED ORDER — GABAPENTIN 300 MG PO CAPS
ORAL_CAPSULE | ORAL | Status: AC
  Administered 2021-10-03: 300 mg via ORAL
  Filled 2021-10-03: qty 1

## 2021-10-03 MED ORDER — HYDROMORPHONE HCL 1 MG/ML IJ SOLN
0.5000 mg | INTRAMUSCULAR | Status: DC | PRN
  Administered 2021-10-03: 0.5 mg via INTRAVENOUS

## 2021-10-03 MED ORDER — ORAL CARE MOUTH RINSE: 15.0000 mL | Freq: Once | OROMUCOSAL | Status: AC

## 2021-10-03 MED ORDER — DEXAMETHASONE SODIUM PHOSPHATE 10 MG/ML IJ SOLN
INTRAMUSCULAR | Status: AC
  Filled 2021-10-03: qty 1

## 2021-10-03 MED ORDER — ONDANSETRON HCL 4 MG/2ML IJ SOLN
INTRAMUSCULAR | Status: AC
  Filled 2021-10-03: qty 2

## 2021-10-03 MED ORDER — LIDOCAINE HCL (PF) 2 % IJ SOLN
INTRAMUSCULAR | Status: AC
  Filled 2021-10-03: qty 5

## 2021-10-03 MED ORDER — BUPIVACAINE LIPOSOME 1.3 % IJ SUSP: 20.0000 mL | Freq: Once | INTRAMUSCULAR | Status: DC

## 2021-10-03 MED ORDER — ONDANSETRON HCL 4 MG/2ML IJ SOLN
4.0000 mg | Freq: Once | INTRAMUSCULAR | Status: AC | PRN
  Administered 2021-10-03: 4 mg via INTRAVENOUS

## 2021-10-03 MED ORDER — FAMOTIDINE 20 MG PO TABS
ORAL_TABLET | ORAL | Status: AC
  Administered 2021-10-03: 20 mg via ORAL
  Filled 2021-10-03: qty 1

## 2021-10-03 MED ORDER — PROPOFOL 10 MG/ML IV BOLUS
INTRAVENOUS | Status: DC | PRN
  Administered 2021-10-03: 150 mg via INTRAVENOUS

## 2021-10-03 MED ORDER — BUPIVACAINE-EPINEPHRINE (PF) 0.25% -1:200000 IJ SOLN
INTRAMUSCULAR | Status: AC
  Filled 2021-10-03: qty 30

## 2021-10-03 MED ORDER — ARTIFICIAL TEARS OPHTHALMIC OINT
TOPICAL_OINTMENT | OPHTHALMIC | Status: AC
  Filled 2021-10-03: qty 7

## 2021-10-03 MED ORDER — LACTATED RINGERS IV SOLN: INTRAVENOUS | Status: DC

## 2021-10-03 MED ORDER — BUPIVACAINE-EPINEPHRINE (PF) 0.25% -1:200000 IJ SOLN
INTRAMUSCULAR | Status: DC | PRN
  Administered 2021-10-03: 30 mL

## 2021-10-03 MED ORDER — EPHEDRINE 5 MG/ML INJ
INTRAVENOUS | Status: AC
  Filled 2021-10-03: qty 5

## 2021-10-03 MED ORDER — IBUPROFEN 800 MG PO TABS: 800.0000 mg | ORAL_TABLET | Freq: Three times a day (TID) | ORAL | 0 refills | Status: AC | PRN

## 2021-10-03 MED ORDER — HYDROMORPHONE HCL 1 MG/ML IJ SOLN
INTRAMUSCULAR | Status: AC
  Administered 2021-10-03: 0.5 mg via INTRAVENOUS
  Filled 2021-10-03: qty 1

## 2021-10-03 MED ORDER — CELECOXIB 200 MG PO CAPS: 200.0000 mg | ORAL_CAPSULE | ORAL | Status: AC

## 2021-10-03 MED ORDER — ACETAMINOPHEN 500 MG PO TABS
ORAL_TABLET | ORAL | Status: AC
  Administered 2021-10-03: 1000 mg via ORAL
  Filled 2021-10-03: qty 2

## 2021-10-03 MED ORDER — FENTANYL CITRATE (PF) 100 MCG/2ML IJ SOLN
INTRAMUSCULAR | Status: DC | PRN
  Administered 2021-10-03 (×2): 25 ug via INTRAVENOUS
  Administered 2021-10-03: 50 ug via INTRAVENOUS

## 2021-10-03 MED ORDER — CHLORHEXIDINE GLUCONATE 0.12 % MT SOLN
15.0000 mL | Freq: Once | OROMUCOSAL | Status: AC
  Administered 2021-10-03: 15 mL via OROMUCOSAL

## 2021-10-03 MED ORDER — ACETAMINOPHEN 500 MG PO TABS: 1000.0000 mg | ORAL_TABLET | ORAL | Status: AC

## 2021-10-03 MED ORDER — EPHEDRINE SULFATE 50 MG/ML IJ SOLN
INTRAMUSCULAR | Status: DC | PRN
  Administered 2021-10-03: 15 mg via INTRAVENOUS
  Administered 2021-10-03: 10 mg via INTRAVENOUS

## 2021-10-03 MED ORDER — PHENYLEPHRINE HCL (PRESSORS) 10 MG/ML IV SOLN
INTRAVENOUS | Status: DC | PRN
  Administered 2021-10-03 (×2): 40 ug via INTRAVENOUS

## 2021-10-03 MED ORDER — CHLORHEXIDINE GLUCONATE 0.12 % MT SOLN
OROMUCOSAL | Status: AC
  Filled 2021-10-03: qty 15

## 2021-10-03 SURGICAL SUPPLY — 48 items
ADH SKN CLS APL DERMABOND .7 (GAUZE/BANDAGES/DRESSINGS) ×2
APL PRP STRL LF DISP 70% ISPRP (MISCELLANEOUS)
BLADE CLIPPER SURG (BLADE) ×4 IMPLANT
CANNULA CAP OBTURATR AIRSEAL 8 (CAP) ×4 IMPLANT
CHLORAPREP W/TINT 26 (MISCELLANEOUS) IMPLANT
COVER TIP SHEARS 8 DVNC (MISCELLANEOUS) ×2 IMPLANT
COVER TIP SHEARS 8MM DA VINCI (MISCELLANEOUS) ×2
COVER WAND RF STERILE (DRAPES) ×4 IMPLANT
DEFOGGER SCOPE WARMER CLEARIFY (MISCELLANEOUS) ×4 IMPLANT
DERMABOND ADVANCED (GAUZE/BANDAGES/DRESSINGS) ×2
DERMABOND ADVANCED .7 DNX12 (GAUZE/BANDAGES/DRESSINGS) ×2 IMPLANT
DRAPE 3/4 80X56 (DRAPES) IMPLANT
DRAPE ARM DVNC X/XI (DISPOSABLE) ×6 IMPLANT
DRAPE COLUMN DVNC XI (DISPOSABLE) ×2 IMPLANT
DRAPE DA VINCI XI ARM (DISPOSABLE) ×6
DRAPE DA VINCI XI COLUMN (DISPOSABLE) ×2
ELECT REM PT RETURN 9FT ADLT (ELECTROSURGICAL) ×4
ELECTRODE REM PT RTRN 9FT ADLT (ELECTROSURGICAL) ×2 IMPLANT
GAUZE 4X4 16PLY ~~LOC~~+RFID DBL (SPONGE) ×4 IMPLANT
GLOVE SURG ORTHO LTX SZ7.5 (GLOVE) ×12 IMPLANT
GOWN STRL REUS W/ TWL LRG LVL3 (GOWN DISPOSABLE) ×6 IMPLANT
GOWN STRL REUS W/TWL LRG LVL3 (GOWN DISPOSABLE) ×12
GRASPER SUT TROCAR 14GX15 (MISCELLANEOUS) IMPLANT
IRRIGATION STRYKERFLOW (MISCELLANEOUS) IMPLANT
IRRIGATOR STRYKERFLOW (MISCELLANEOUS)
IV CATH ANGIO 14GX1.88 NO SAFE (IV SOLUTION) ×4 IMPLANT
IV NS 1000ML (IV SOLUTION)
IV NS 1000ML BAXH (IV SOLUTION) IMPLANT
KIT PINK PAD W/HEAD ARE REST (MISCELLANEOUS) ×4
KIT PINK PAD W/HEAD ARM REST (MISCELLANEOUS) ×2 IMPLANT
LABEL OR SOLS (LABEL) ×4 IMPLANT
MANIFOLD NEPTUNE II (INSTRUMENTS) ×4 IMPLANT
MESH 3DMAX LIGHT 4.1X6.2 RT LR (Mesh General) ×4 IMPLANT
NEEDLE HYPO 22GX1.5 SAFETY (NEEDLE) ×4 IMPLANT
NEEDLE INSUFFLATION 14GA 120MM (NEEDLE) ×4 IMPLANT
PACK LAP CHOLECYSTECTOMY (MISCELLANEOUS) ×4 IMPLANT
SEAL CANN UNIV 5-8 DVNC XI (MISCELLANEOUS) ×4 IMPLANT
SEAL XI 5MM-8MM UNIVERSAL (MISCELLANEOUS) ×4
SET TUBE FILTERED XL AIRSEAL (SET/KITS/TRAYS/PACK) ×4 IMPLANT
SOLUTION ELECTROLUBE (MISCELLANEOUS) ×4 IMPLANT
SUT MNCRL 4-0 (SUTURE) ×4
SUT MNCRL 4-0 27XMFL (SUTURE) ×2
SUT VIC AB 0 CT2 27 (SUTURE) ×4 IMPLANT
SUT VLOC 90 2/L VL 12 GS22 (SUTURE) IMPLANT
SUT VLOC 90 S/L VL9 GS22 (SUTURE) ×4 IMPLANT
SUTURE MNCRL 4-0 27XMF (SUTURE) ×2 IMPLANT
TROCAR Z-THREAD FIOS 11X100 BL (TROCAR) IMPLANT
WATER STERILE IRR 500ML POUR (IV SOLUTION) IMPLANT

## 2021-10-03 NOTE — Transfer of Care (Signed)
Immediate Anesthesia Transfer of Care Note  Patient: William Schwartz  Procedure(s) Performed: XI ROBOTIC ASSISTED INGUINAL HERNIA (Right) INSERTION OF MESH  Patient Location: PACU  Anesthesia Type:General  Level of Consciousness: awake and alert   Airway & Oxygen Therapy: Patient Spontanous Breathing  Post-op Assessment: Report given to RN and Post -op Vital signs reviewed and stable  Post vital signs: Reviewed and stable  Last Vitals:  Vitals Value Taken Time  BP 164/74 10/03/21 1235  Temp 35.9 C 10/03/21 1235  Pulse 81 10/03/21 1240  Resp 15 10/03/21 1240  SpO2 96 % 10/03/21 1240  Vitals shown include unvalidated device data.  Last Pain:  Vitals:   10/03/21 0901  TempSrc: Temporal  PainSc: 0-No pain         Complications: No notable events documented.

## 2021-10-03 NOTE — Interval H&P Note (Signed)
History and Physical Interval Note:  10/03/2021 12:52 PM  William Schwartz  has presented today for surgery, with the diagnosis of right inguinal hernia.  The various methods of treatment have been discussed with the patient and family. After consideration of risks, benefits and other options for treatment, the patient has consented to  Procedure(s): XI ROBOTIC ASSISTED INGUINAL HERNIA (Right) INSERTION OF MESH as a surgical intervention.  The patient's history has been reviewed, patient examined, no change in status, stable for surgery.  I have reviewed the patient's chart and labs.  Questions were answered to the patient's satisfaction.    The right is marked.  Campbell Lerner

## 2021-10-03 NOTE — Op Note (Signed)
Robotic assisted Laparoscopic Transabdominal right inguinal Hernia Repair with Mesh       Pre-operative Diagnosis: Right inguinal Hernia   Post-operative Diagnosis: Same   Procedure: Robotic assisted Laparoscopic  repair of right indirect inguinal hernia(s)   Surgeon: Campbell Lerner, M.D., FACS   Anesthesia: GETA   Findings: Right indirect inguinal hernia, no evidence of left sided hernia.         Procedure Details  The patient was seen again in the Holding Room. The benefits, complications, treatment options, and expected outcomes were discussed with the patient. The risks of bleeding, infection, recurrence of symptoms, failure to resolve symptoms, recurrence of hernia, ischemic orchitis, chronic pain syndrome or neuroma, were reviewed again. The likelihood of improving the patient's symptoms with return to their baseline status is good.  The patient and/or family concurred with the proposed plan, giving informed consent.  The patient was taken to Operating Room, identified  and the procedure verified as Laparoscopic Inguinal Hernia Repair. Laterality confirmed.  A Time Out was held and the above information confirmed.   Prior to the induction of general anesthesia, antibiotic prophylaxis was administered. VTE prophylaxis was in place. General endotracheal anesthesia was then administered and tolerated well. After the induction, the abdomen was prepped with Chloraprep and draped in the sterile fashion. The patient was positioned in the supine position.   After local infiltration of quarter percent Marcaine with epinephrine, stab incision was made left upper quadrant.  On the left at Palmer's point, the Veress needle is passed with sensation of the layers to penetrate the abdominal wall and into the peritoneum.  Saline drop test is confirmed peritoneal placement.  Insufflation is initiated with carbon dioxide to pressures of 15 mmHg. An 8.5 mm port is placed to the left off of the midline,  with blunt tipped trocar.  Pneumoperitoneum maintained w/o HD changes using the AirSeal to pressures of 15 mm Hg with CO2. No evidence of bowel injuries.  Two 8.5 mm ports placed under direct vision in each upper quadrant. The laparoscopy revealed right indirect defect(s).   The robot was brought ot the table and docked in the standard fashion, no collision between arms was observed. Instruments were kept under direct view at all times. For right inguinal hernia repair,  I developed a peritoneal flap. The sac(s) were reduced and dissected free from adjacent structures. We preserved the vas and the vessels, and visualized them to their convergence and beyond in the retroperitoneum. Once dissection was completed a large right sided BARD 3D Light mesh was placed and secured at three points with interrupted 0 Vicryl to the pubic tubercle and anteriorly. There was good coverage of the direct, indirect and femoral spaces.  Second look revealed no complications or injuries.  The flap was then closed with 2-0 V-lock suture.  Peritoneal closure without defects.  Once assuring that hemostasis was adequate, all needles/sponges removed, and the robot was undocked.  A large angiocath is placed under direct visualization in the groin to reduce trapped extraperitoneal air and confirm adequate peritoneal closure.  The ports were removed, the abdomen desulflated.  4-0 subcuticular Monocryl was used at all skin edges. Dermabond was placed.  Patient tolerated the procedure well. There were no complications. He was taken to the recovery room in stable condition.           Campbell Lerner, M.D., FACS 10/03/2021, 12:45 PM

## 2021-10-03 NOTE — Anesthesia Preprocedure Evaluation (Signed)
Anesthesia Evaluation  Patient identified by MRN, date of birth, ID band Patient awake    Reviewed: Allergy & Precautions, NPO status , [name] Chart, lab work & pertinent test results, reviewed documented beta blocker date and time   Airway Mallampati: II  TM Distance: >3 FB Neck ROM: Full    Dental  (+) Partial Upper, Lower Dentures, Poor Dentition   Pulmonary neg pulmonary ROS, former smoker,    Pulmonary exam normal        Cardiovascular hypertension, Pt. on medications and Pt. on home beta blockers negative cardio ROS Normal cardiovascular exam     Neuro/Psych PSYCHIATRIC DISORDERS Dementia  Neuromuscular disease    GI/Hepatic negative GI ROS, Neg liver ROS,   Endo/Other  Hypothyroidism   Renal/GU negative Renal ROS  negative genitourinary   Musculoskeletal negative musculoskeletal ROS (+)   Abdominal   Peds negative pediatric ROS (+)  Hematology negative hematology ROS (+)   Anesthesia Other Findings Cognitive communication deficit  Hyperlipidemia    Hypertension    Unspecified dementia, unspecified severity, without behavioral disturbance, psychotic disturbance, mood disturbance, and anxiety (HCC)       Reproductive/Obstetrics negative OB ROS                             Anesthesia Physical Anesthesia Plan  ASA: 3  Anesthesia Plan: General   Post-op Pain Management:    Induction: Intravenous  PONV Risk Score and Plan: 2 and Propofol infusion, Ondansetron and Midazolam  Airway Management Planned: Oral ETT  Additional Equipment:   Intra-op Plan:   Post-operative Plan: Extubation in OR  Informed Consent: I have reviewed the patients History and Physical, chart, labs and discussed the procedure including the risks, benefits and alternatives for the proposed anesthesia with the patient or authorized representative who has indicated his/her understanding and acceptance.        Plan Discussed with: CRNA, Anesthesiologist and Surgeon  Anesthesia Plan Comments:         Anesthesia Quick Evaluation

## 2021-10-03 NOTE — Anesthesia Procedure Notes (Signed)
Procedure Name: Intubation Date/Time: 10/03/2021 11:12 AM Performed by: Jannet Mantis, CRNA Pre-anesthesia Checklist: Patient identified, Timeout performed, Emergency Drugs available, Suction available and Patient being monitored Patient Re-evaluated:Patient Re-evaluated prior to induction Oxygen Delivery Method: Circle system utilized Preoxygenation: Pre-oxygenation with 100% oxygen Induction Type: IV induction Ventilation: Oral airway inserted - appropriate to patient size Laryngoscope Size: Glidescope and 4 Grade View: Grade I Tube type: Oral Tube size: 7.0 mm Number of attempts: 1 Placement Confirmation: ETT inserted through vocal cords under direct vision Secured at: 22 cm Tube secured with: Tape Dental Injury: Teeth and Oropharynx as per pre-operative assessment

## 2021-10-03 NOTE — Discharge Instructions (Signed)
AMBULATORY SURGERY  ?DISCHARGE INSTRUCTIONS ? ? ?The drugs that you were given will stay in your system until tomorrow so for the next 24 hours you should not: ? ?Drive an automobile ?Make any legal decisions ?Drink any alcoholic beverage ? ? ?You may resume regular meals tomorrow.  Today it is better to start with liquids and gradually work up to solid foods. ? ?You may eat anything you prefer, but it is better to start with liquids, then soup and crackers, and gradually work up to solid foods. ? ? ?Please notify your doctor immediately if you have any unusual bleeding, trouble breathing, redness and pain at the surgery site, drainage, fever, or pain not relieved by medication. ? ? ? ?Additional Instructions: ? ? ? ?Please contact your physician with any problems or Same Day Surgery at [phone], Monday through Friday 6 am to 4 pm, or [place] [facility] [place] Main number at [phone].  ?

## 2021-10-03 NOTE — Anesthesia Postprocedure Evaluation (Signed)
Anesthesia Post Note  Patient: Julaine Hua  Procedure(s) Performed: XI ROBOTIC ASSISTED INGUINAL HERNIA (Right) INSERTION OF MESH  Patient location during evaluation: PACU Anesthesia Type: General Level of consciousness: awake and alert, awake and oriented Pain management: pain level controlled Vital Signs Assessment: post-procedure vital signs reviewed and stable Respiratory status: spontaneous breathing, nonlabored ventilation and respiratory function stable Cardiovascular status: blood pressure returned to baseline and stable Postop Assessment: no apparent nausea or vomiting Anesthetic complications: no   No notable events documented.   Last Vitals:  Vitals:   10/03/21 1315 10/03/21 1345  BP: 137/68 140/76  Pulse: 69 69  Resp: 11 12  Temp: (!) 36 C (!) 36.1 C  SpO2: 97% 98%    Last Pain:  Vitals:   10/03/21 0901  TempSrc: Temporal  PainSc: 0-No pain                 Manfred Arch

## 2021-10-04 ENCOUNTER — Encounter: Payer: Self-pay | Admitting: Surgery

## 2021-10-18 ENCOUNTER — Other Ambulatory Visit: Payer: Self-pay

## 2021-10-18 ENCOUNTER — Ambulatory Visit (INDEPENDENT_AMBULATORY_CARE_PROVIDER_SITE_OTHER): Payer: No Typology Code available for payment source | Admitting: Physician Assistant

## 2021-10-18 ENCOUNTER — Encounter: Payer: Self-pay | Admitting: Physician Assistant

## 2021-10-18 VITALS — BP 124/68 | HR 56 | Temp 98.2°F | Ht 72.0 in | Wt 145.0 lb

## 2021-10-18 DIAGNOSIS — K4090 Unilateral inguinal hernia, without obstruction or gangrene, not specified as recurrent: Secondary | ICD-10-CM

## 2021-10-18 DIAGNOSIS — Z09 Encounter for follow-up examination after completed treatment for conditions other than malignant neoplasm: Secondary | ICD-10-CM

## 2021-10-18 NOTE — Progress Notes (Signed)
[facility] POST-OP OFFICE VISIT  10/18/2021  HPI: William Schwartz is a 75 y.o. male 15 days s/p robotic assisted laparoscopic right inguinal hernia repair with Dr Claudine Mouton  He seems to be doing well. He does have a history of dementia, so history is difficult. He has staff from his assisted living facility and they report no issues.   Vital signs: BP 124/68   Pulse (!) 56   Temp 98.2 F (36.8 C) (Oral)   Ht 6' (1.829 m)   Wt 145 lb (65.8 kg)   SpO2 99%   BMI 19.67 kg/m    Physical Exam: Constitutional: Well appearing male, NAD Abdomen: Soft, non-tender, non-distended, no rebound/guarding. No evidence of recurrence Skin: Laparoscopic incisions are well healed  Assessment/Plan: This is a 75 y.o. male 15 days s/p robotic assisted laparoscopic right inguinal hernia repair   - Pain control prn  - Reviewed wound care  - Reviewed lifting restrictions: 6 weeks total  - He can follow up on as needed basis   -- Lynden Oxford, PA-C [facility] 10/18/2021, 11:01 AM [phone] M-F: 7am - 4pm

## 2021-10-18 NOTE — Patient Instructions (Signed)
If you have any concerns or questions, please feel free to call our office.   GENERAL POST-OPERATIVE PATIENT INSTRUCTIONS   WOUND CARE INSTRUCTIONS:  Keep a dry clean dressing on the wound if there is drainage. The initial bandage may be removed after 24 hours.  Once the wound has quit draining you may leave it open to air.  If clothing rubs against the wound or causes irritation and the wound is not draining you may cover it with a dry dressing during the daytime.  Try to keep the wound dry and avoid ointments on the wound unless directed to do so.  If the wound becomes bright red and painful or starts to drain infected material that is not clear, please contact your physician immediately.  If the wound is mildly pink and has a thick firm ridge underneath it, this is normal, and is referred to as a healing ridge.  This will resolve over the next 4-6 weeks.  BATHING: You may shower if you have been informed of this by your surgeon. However, Please do not submerge in a tub, hot tub, or pool until incisions are completely sealed or have been told by your surgeon that you may do so.  DIET:  You may eat any foods that you can tolerate.  It is a good idea to eat a high fiber diet and take in plenty of fluids to prevent constipation.  If you do become constipated you may want to take a mild laxative or take ducolax tablets on a daily basis until your bowel habits are regular.  Constipation can be very uncomfortable, along with straining, after recent surgery.  ACTIVITY:  You are encouraged to cough and deep breath or use your incentive spirometer if you were given one, every 15-30 minutes when awake.  This will help prevent respiratory complications and low grade fevers post-operatively if you had a general anesthetic.  You may want to hug a pillow when coughing and sneezing to add additional support to the surgical area, if you had abdominal or chest surgery, which will decrease pain during these times.  You  are encouraged to walk and engage in light activity for the next two weeks.  You should not lift, push, or pull more than 10/15 pounds, until 11/14/2021 as it could put you at increased risk for complications.  Twenty pounds is roughly equivalent to a plastic bag of groceries. At that time- Listen to your body when lifting, if you have pain when lifting, stop and then try again in a few days. Soreness after doing exercises or activities of daily living is normal as you get back in to your normal routine.  MEDICATIONS:  Try to take narcotic medications and anti-inflammatory medications, such as tylenol, ibuprofen, naprosyn, etc., with food.  This will minimize stomach upset from the medication.  Should you develop nausea and vomiting from the pain medication, or develop a rash, please discontinue the medication and contact your physician.  You should not drive, make important decisions, or operate machinery when taking narcotic pain medication.  SUNBLOCK Use sun block to incision area over the next year if this area will be exposed to sun. This helps decrease scarring and will allow you avoid a permanent darkened area over your incision.  Laparoscopic Inguinal Hernia Repair, Adult, Care After  The following information offers guidance on how to care for yourself after your procedure. Your health care provider may also give you more specific instructions. If you have problems or questions,  contact your health care provider. What can I expect after the procedure? After the procedure, it is common to have: Pain. Swelling and bruising around the incision area. Scrotal swelling, in males. Some fluid or blood draining from your incisions. Follow these instructions at home: Medicines Take over-the-counter and prescription medicines only as told by your health care provider. Ask your health care provider if the medicine prescribed to you: Requires you to avoid driving or using machinery. Can cause  constipation. You may need to take these actions to prevent or treat constipation: Drink enough fluid to keep your urine pale yellow. Take over-the-counter or prescription medicines. Eat foods that are high in fiber, such as beans, whole grains, and fresh fruits and vegetables. Limit foods that are high in fat and processed sugars, such as fried or sweet foods. Incision care  Follow instructions from your health care provider about how to take care of your incisions. Make sure you: Wash your hands with soap and water for at least 20 seconds before and after you change your bandage (dressing). If soap and water are not available, use hand sanitizer. Change your dressing as told by your health care provider. Leave stitches (sutures), skin glue, or adhesive strips in place. These skin closures may need to stay in place for 2 weeks or longer. If adhesive strip edges start to loosen and curl up, you may trim the loose edges. Do not remove adhesive strips completely unless your health care provider tells you to do that. Check your incision area every day for signs of infection. Check for: More redness, swelling, or pain. More fluid or blood. Warmth. Pus or a bad smell. Wear loose, soft clothing while your incisions heal. Managing pain and swelling If directed, put ice on the painful or swollen areas. To do this: Put ice in a plastic bag. Place a towel between your skin and the bag. Leave the ice on for 20 minutes, 2-3 times a day. Remove the ice if your skin turns bright red. This is very important. If you cannot feel pain, heat, or cold, you have a greater risk of damage to the area.  Activity Do not lift anything that is heavier than 10 lb (4.5 kg), or the limit that you are told, until your health care provider says that it is safe. Ask your health care provider what activities are safe for you. A lot of activity during the first week after surgery can increase pain and swelling. For 1 week  after your procedure: Avoid activities that take a lot of effort, such as exercise or sports. You may walk and climb stairs as needed for daily activity, but avoid long walks or climbing stairs for exercise. General instructions If you were given a sedative during the procedure, it can affect you for several hours. Do not drive or operate machinery until your health care provider says that it is safe. Do not take baths, swim, or use a hot tub until your health care provider approves. Ask your health care provider if you may take showers. You may only be allowed to take sponge baths. Do not use any products that contain nicotine or tobacco. These products include cigarettes, chewing tobacco, and vaping devices, such as e-cigarettes. If you need help quitting, ask your health care provider. Keep all follow-up visits. This is important. Contact a health care provider if: You have any of these signs of infection: More redness, swelling, or pain around your incisions or your groin area. More fluid  or blood coming from an incision. Warmth coming from an incision. Pus or a bad smell coming from an incision. A fever or chills. You have more swelling in your scrotum, if you are male. You have severe pain and medicines do not help. You have abdominal pain or swelling. You cannot urinate or have a bowel movement. You faint or feel dizzy. You have nausea and vomiting. Get help right away if: You have redness, warmth, or pain in your leg. You have chest pain. You have problems breathing. These symptoms may represent a serious problem that is an emergency. Do not wait to see if the symptoms will go away. Get medical help right away. Call your local emergency services (911 in the [place] Do not drive yourself to the hospital. Summary Pain, swelling, and bruising are common after the procedure. Check your incision area every day for signs of infection, such as more redness, swelling, or pain. Put ice on  painful or swollen areas for 20 minutes, 2-3 times a day. This information is not intended to replace advice given to you by your health care provider. Make sure you discuss any questions you have with your health care provider. Document Revised: 07/04/2020 Document Reviewed: 07/04/2020 [employer]vier Patient Education  2022 [employer].

## 2022-10-12 IMAGING — CT CT HEAD W/O CM
4 series · 17 of 47 positions shown, 19 images · non-contrast
Comparison: 07/06/2021

CLINICAL DATA: Fall, head trauma

EXAM:
CT HEAD WITHOUT CONTRAST
TECHNIQUE: Contiguous axial images were obtained from the base of the skull
through the vertex without intravenous contrast.

[Series 2: head wo · axial · 0.45mm/px · z∈[-105,+20]mm · 7 of 35 slices shown, 9 images]
[im 5/35  brain]
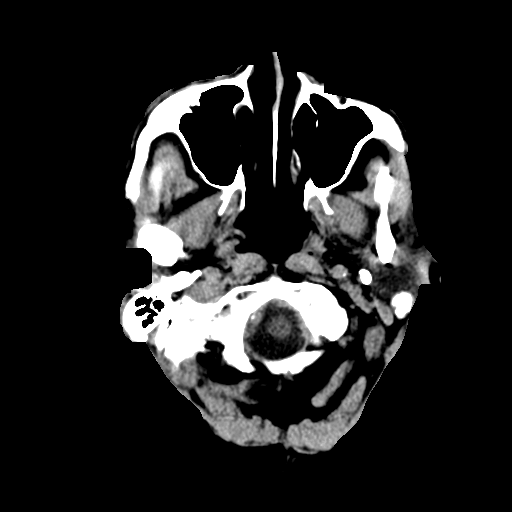
[im 5/35  bone]
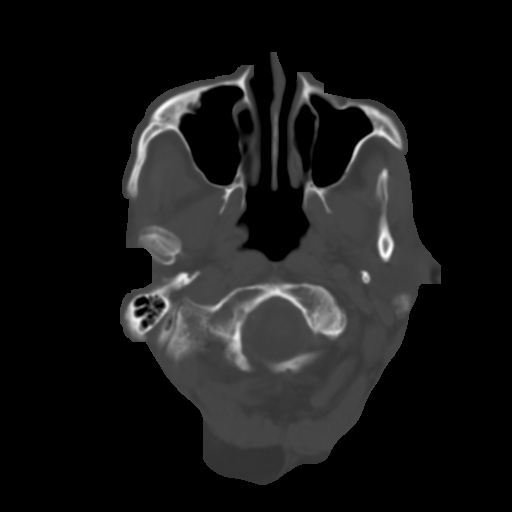
[im 9/35  brain]
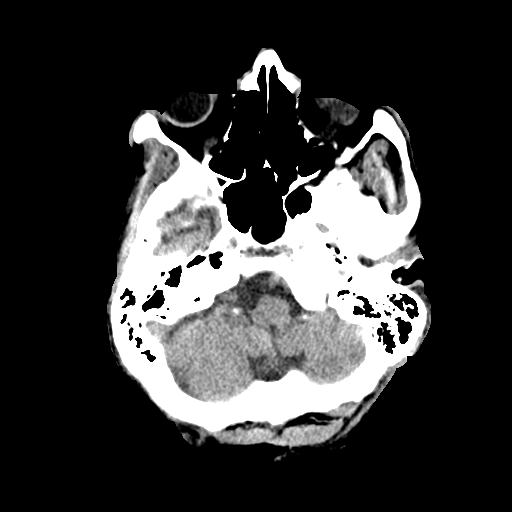
[im 13/35  brain]
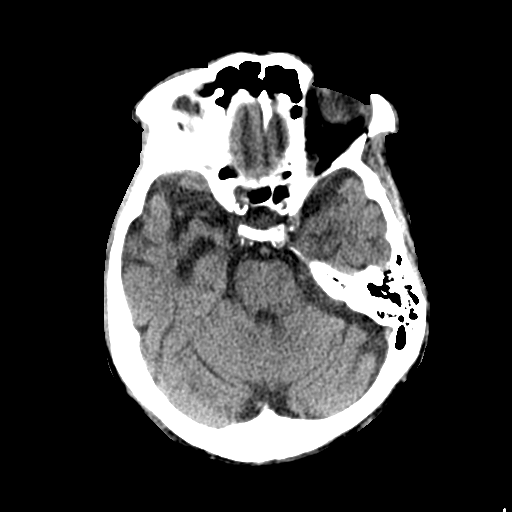
[im 18/35  brain]
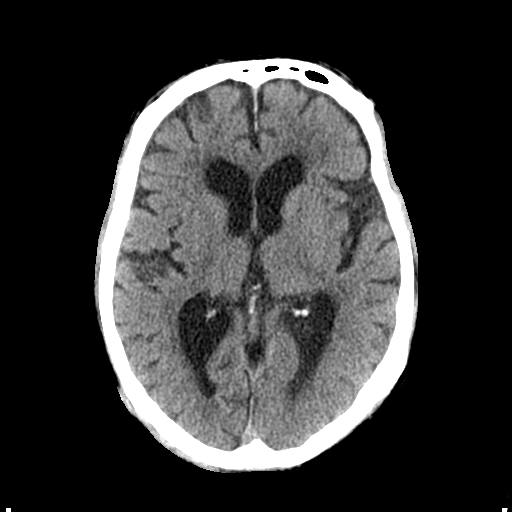
[im 22/35  brain]
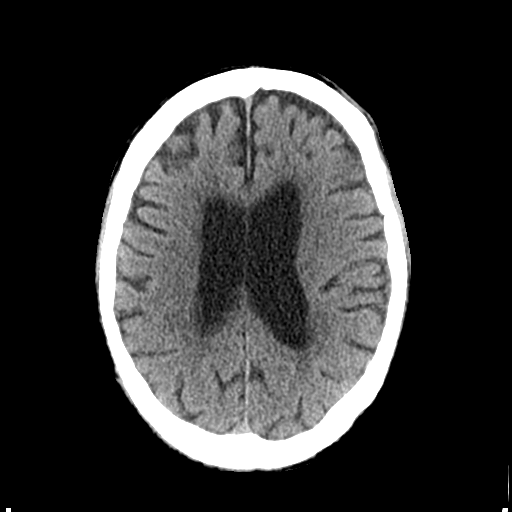
[im 22/35  bone]
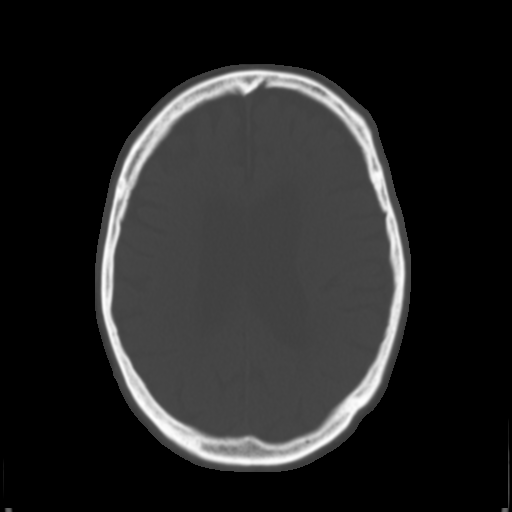
[im 26/35  brain]
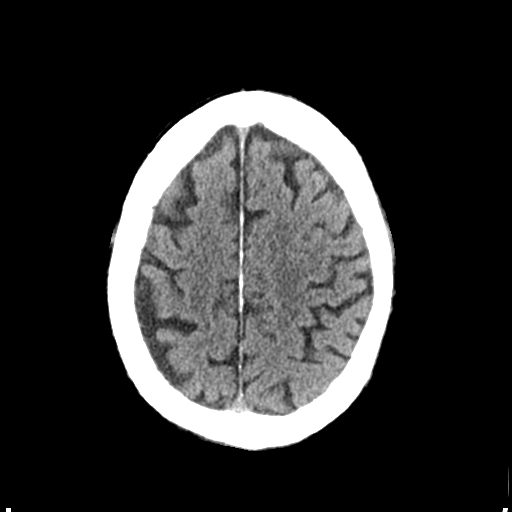
[im 30/35  brain]
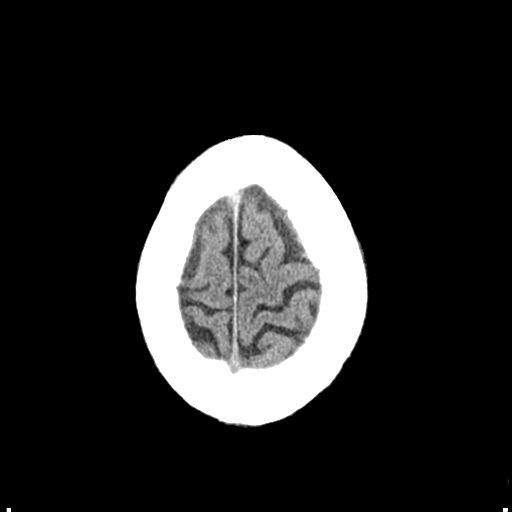

[Series 3: head bone · axial · 0.45mm/px · z∈[-109,-49]mm · 4 of 87 slices shown]
[im 9/87  bone]
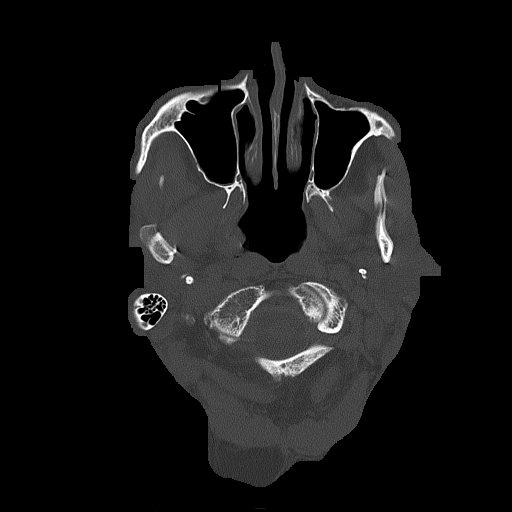
[im 18/87  bone]
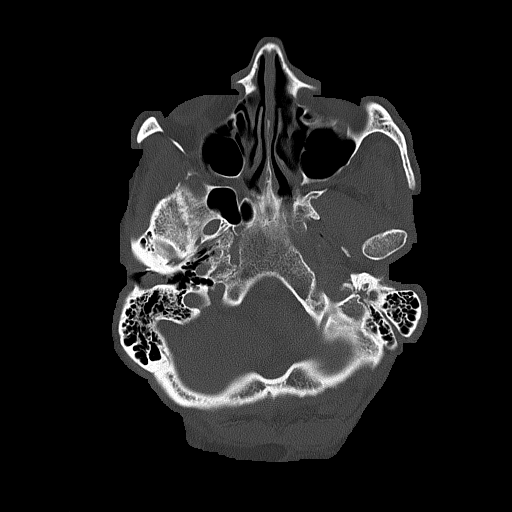
[im 26/87  bone]
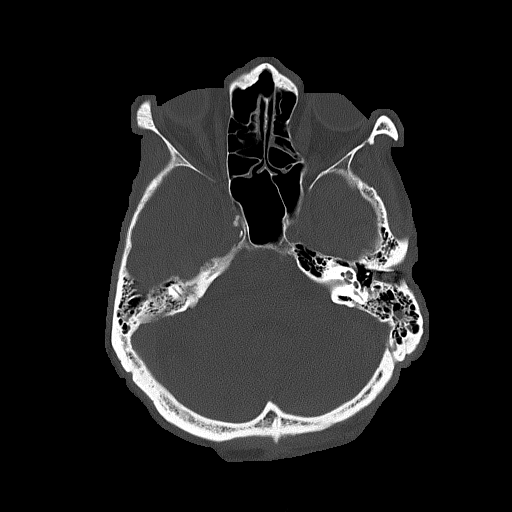
[im 39/87  bone]
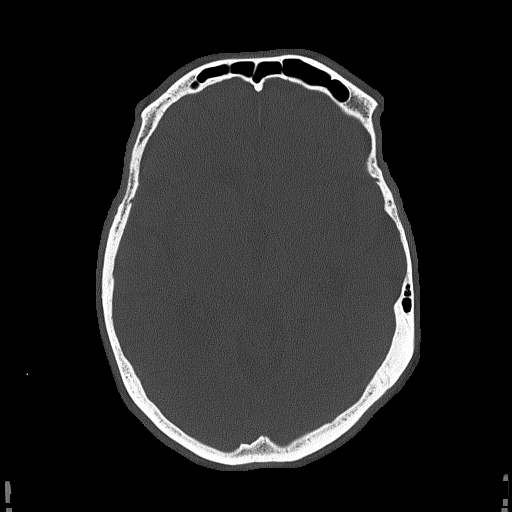

[Series 4: coronal soft tissue · coronal · 0.36mm/px · 3 of 80 slices shown]
[im 27/80  brain]
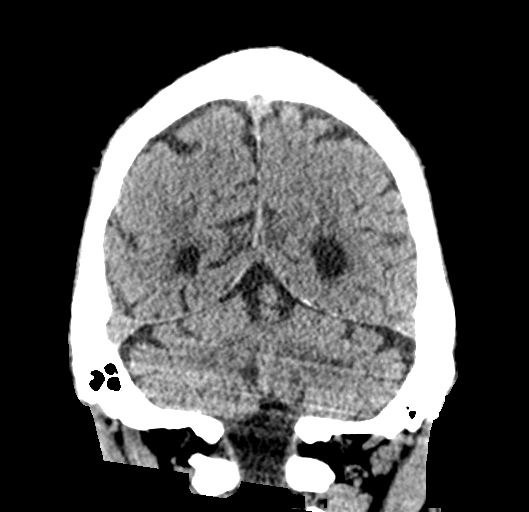
[im 36/80  brain]
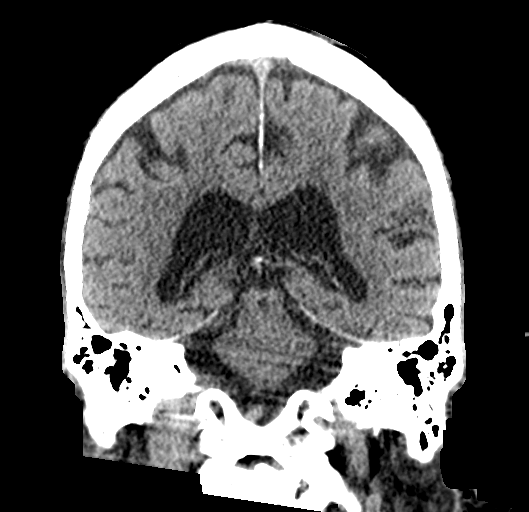
[im 44/80  brain]
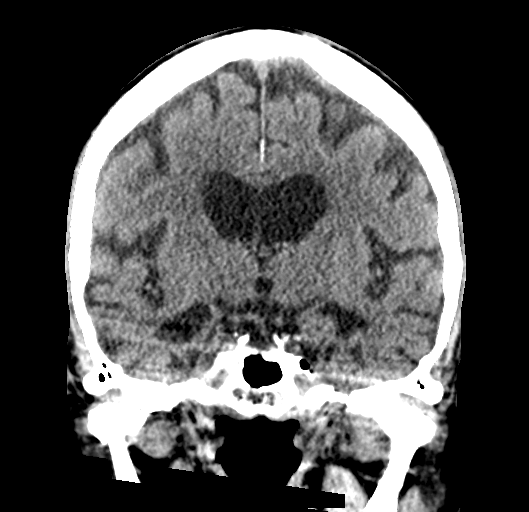

[Series 5: sagittal soft tissue · sagittal · 0.38mm/px · 3 of 56 slices shown]
[im 19/56  brain]
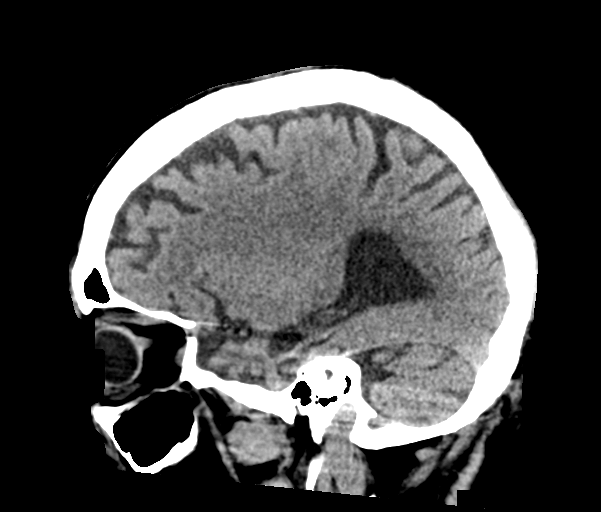
[im 28/56  brain]
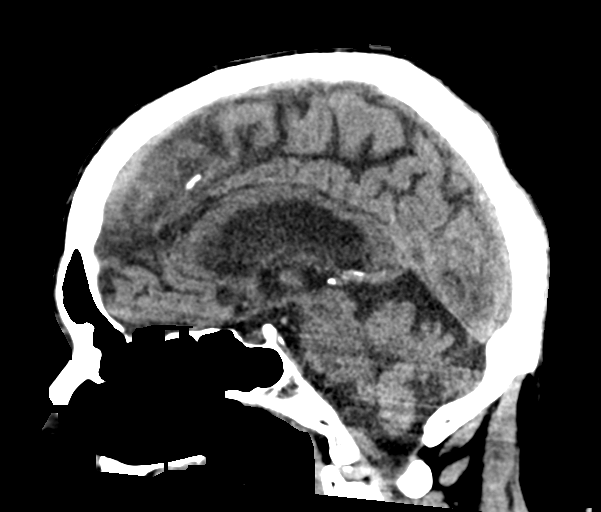
[im 37/56  brain]
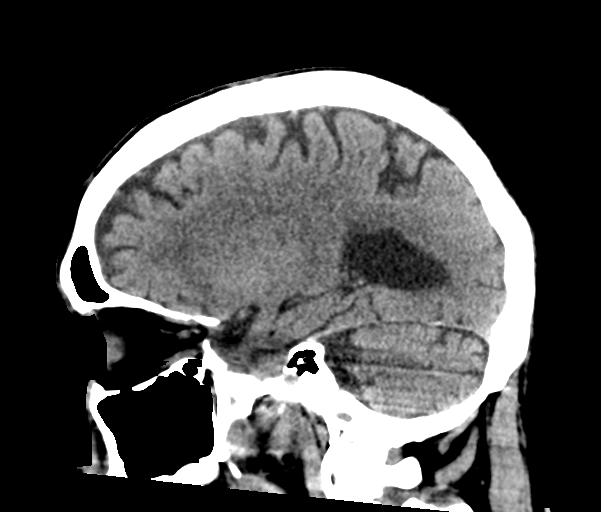

[17 of 47 positions shown; findings below may reference images not displayed]

FINDINGS: Brain: There is atrophy and chronic small vessel disease changes. No
acute intracranial abnormality. Specifically, no hemorrhage,
hydrocephalus, mass lesion, acute infarction, or significant
intracranial injury.

Vascular: No hyperdense vessel or unexpected calcification.

Skull: No acute calvarial abnormality.

Sinuses/Orbits: No acute findings

Other: None
IMPRESSION: Atrophy, chronic microvascular disease.

No acute intracranial abnormality.
